# Patient Record
Sex: Female | Born: 1935 | Race: White | Hispanic: No | Smoking: Never smoker
Health system: Southern US, Community
[De-identification: ages and names within clinical notes are randomized; demographics above are authoritative.]

## PROBLEM LIST (undated history)

## (undated) DIAGNOSIS — E119 Type 2 diabetes mellitus without complications: Secondary | ICD-10-CM

## (undated) DIAGNOSIS — E039 Hypothyroidism, unspecified: Secondary | ICD-10-CM

## (undated) DIAGNOSIS — I2510 Atherosclerotic heart disease of native coronary artery without angina pectoris: Secondary | ICD-10-CM

## (undated) DIAGNOSIS — I255 Ischemic cardiomyopathy: Secondary | ICD-10-CM

## (undated) DIAGNOSIS — I219 Acute myocardial infarction, unspecified: Secondary | ICD-10-CM

## (undated) DIAGNOSIS — I10 Essential (primary) hypertension: Secondary | ICD-10-CM

## (undated) DIAGNOSIS — I5022 Chronic systolic (congestive) heart failure: Secondary | ICD-10-CM

## (undated) DIAGNOSIS — I447 Left bundle-branch block, unspecified: Secondary | ICD-10-CM

## (undated) DIAGNOSIS — E785 Hyperlipidemia, unspecified: Secondary | ICD-10-CM

## (undated) HISTORY — DX: Essential (primary) hypertension: I10

## (undated) HISTORY — PX: MOHS SURGERY: SHX181

## (undated) HISTORY — DX: Left bundle-branch block, unspecified: I44.7

## (undated) HISTORY — DX: Acute myocardial infarction, unspecified: I21.9

## (undated) HISTORY — DX: Hypothyroidism, unspecified: E03.9

## (undated) HISTORY — DX: Chronic systolic (congestive) heart failure: I50.22

## (undated) HISTORY — PX: CARPAL TUNNEL RELEASE: SHX101

## (undated) HISTORY — DX: Atherosclerotic heart disease of native coronary artery without angina pectoris: I25.10

## (undated) HISTORY — DX: Type 2 diabetes mellitus without complications: E11.9

## (undated) HISTORY — DX: Ischemic cardiomyopathy: I25.5

## (undated) HISTORY — PX: TOTAL KNEE ARTHROPLASTY: SHX125

## (undated) HISTORY — DX: Hyperlipidemia, unspecified: E78.5

## (undated) HISTORY — PX: SHOULDER SURGERY: SHX246

## (undated) HISTORY — PX: TONSILLECTOMY: SUR1361

## (undated) HISTORY — PX: TOTAL ABDOMINAL HYSTERECTOMY: SHX209

---

## 2014-01-08 DIAGNOSIS — I10 Essential (primary) hypertension: Secondary | ICD-10-CM | POA: Insufficient documentation

## 2014-01-08 DIAGNOSIS — E039 Hypothyroidism, unspecified: Secondary | ICD-10-CM | POA: Insufficient documentation

## 2014-01-08 DIAGNOSIS — E119 Type 2 diabetes mellitus without complications: Secondary | ICD-10-CM | POA: Insufficient documentation

## 2014-02-27 DIAGNOSIS — I219 Acute myocardial infarction, unspecified: Secondary | ICD-10-CM

## 2014-02-27 HISTORY — DX: Acute myocardial infarction, unspecified: I21.9

## 2014-03-15 ENCOUNTER — Other Ambulatory Visit: Payer: Self-pay | Admitting: Family Medicine

## 2014-03-15 ENCOUNTER — Other Ambulatory Visit: Payer: Self-pay | Admitting: Emergency Medicine

## 2014-03-15 ENCOUNTER — Ambulatory Visit: Payer: Self-pay | Admitting: Family Medicine

## 2014-03-15 LAB — CBC
HCT: 39 % (ref 35.0–47.0)
HGB: 13.5 g/dL (ref 12.0–16.0)
MCH: 30.9 pg (ref 26.0–34.0)
MCHC: 34.6 g/dL (ref 32.0–36.0)
MCV: 89 fL (ref 80–100)
Platelet: 245 10*3/uL (ref 150–440)
RBC: 4.36 10*6/uL (ref 3.80–5.20)
RDW: 13 % (ref 11.5–14.5)
WBC: 8.5 10*3/uL (ref 3.6–11.0)

## 2014-03-15 LAB — BASIC METABOLIC PANEL
Anion Gap: 9 (ref 7–16)
BUN: 13 mg/dL (ref 7–18)
CALCIUM: 8.6 mg/dL (ref 8.5–10.1)
CREATININE: 0.87 mg/dL (ref 0.60–1.30)
Chloride: 99 mmol/L (ref 98–107)
Co2: 25 mmol/L (ref 21–32)
EGFR (African American): >60
GLUCOSE: 275 mg/dL — AB (ref 65–99)
OSMOLALITY: 276 (ref 275–301)
Potassium: 3.7 mmol/L (ref 3.5–5.1)
SODIUM: 133 mmol/L — AB (ref 136–145)

## 2014-03-15 LAB — TROPONIN I: TROPONIN-I: 0.3 ng/mL — AB

## 2014-03-15 LAB — APTT: ACTIVATED PTT: 31.5 s (ref 23.6–35.9)

## 2014-03-15 IMAGING — CR DG CHEST 2V
1 series · 2 of 2 positions shown · non-contrast
Comparison: None.

CLINICAL DATA: Chest pain

EXAM:
CHEST  2 VIEW

[Series 1: dxr chest pa (or ap) and lateral · 0.14mm/px · 2 of 2 slices shown]
[im 1/2]
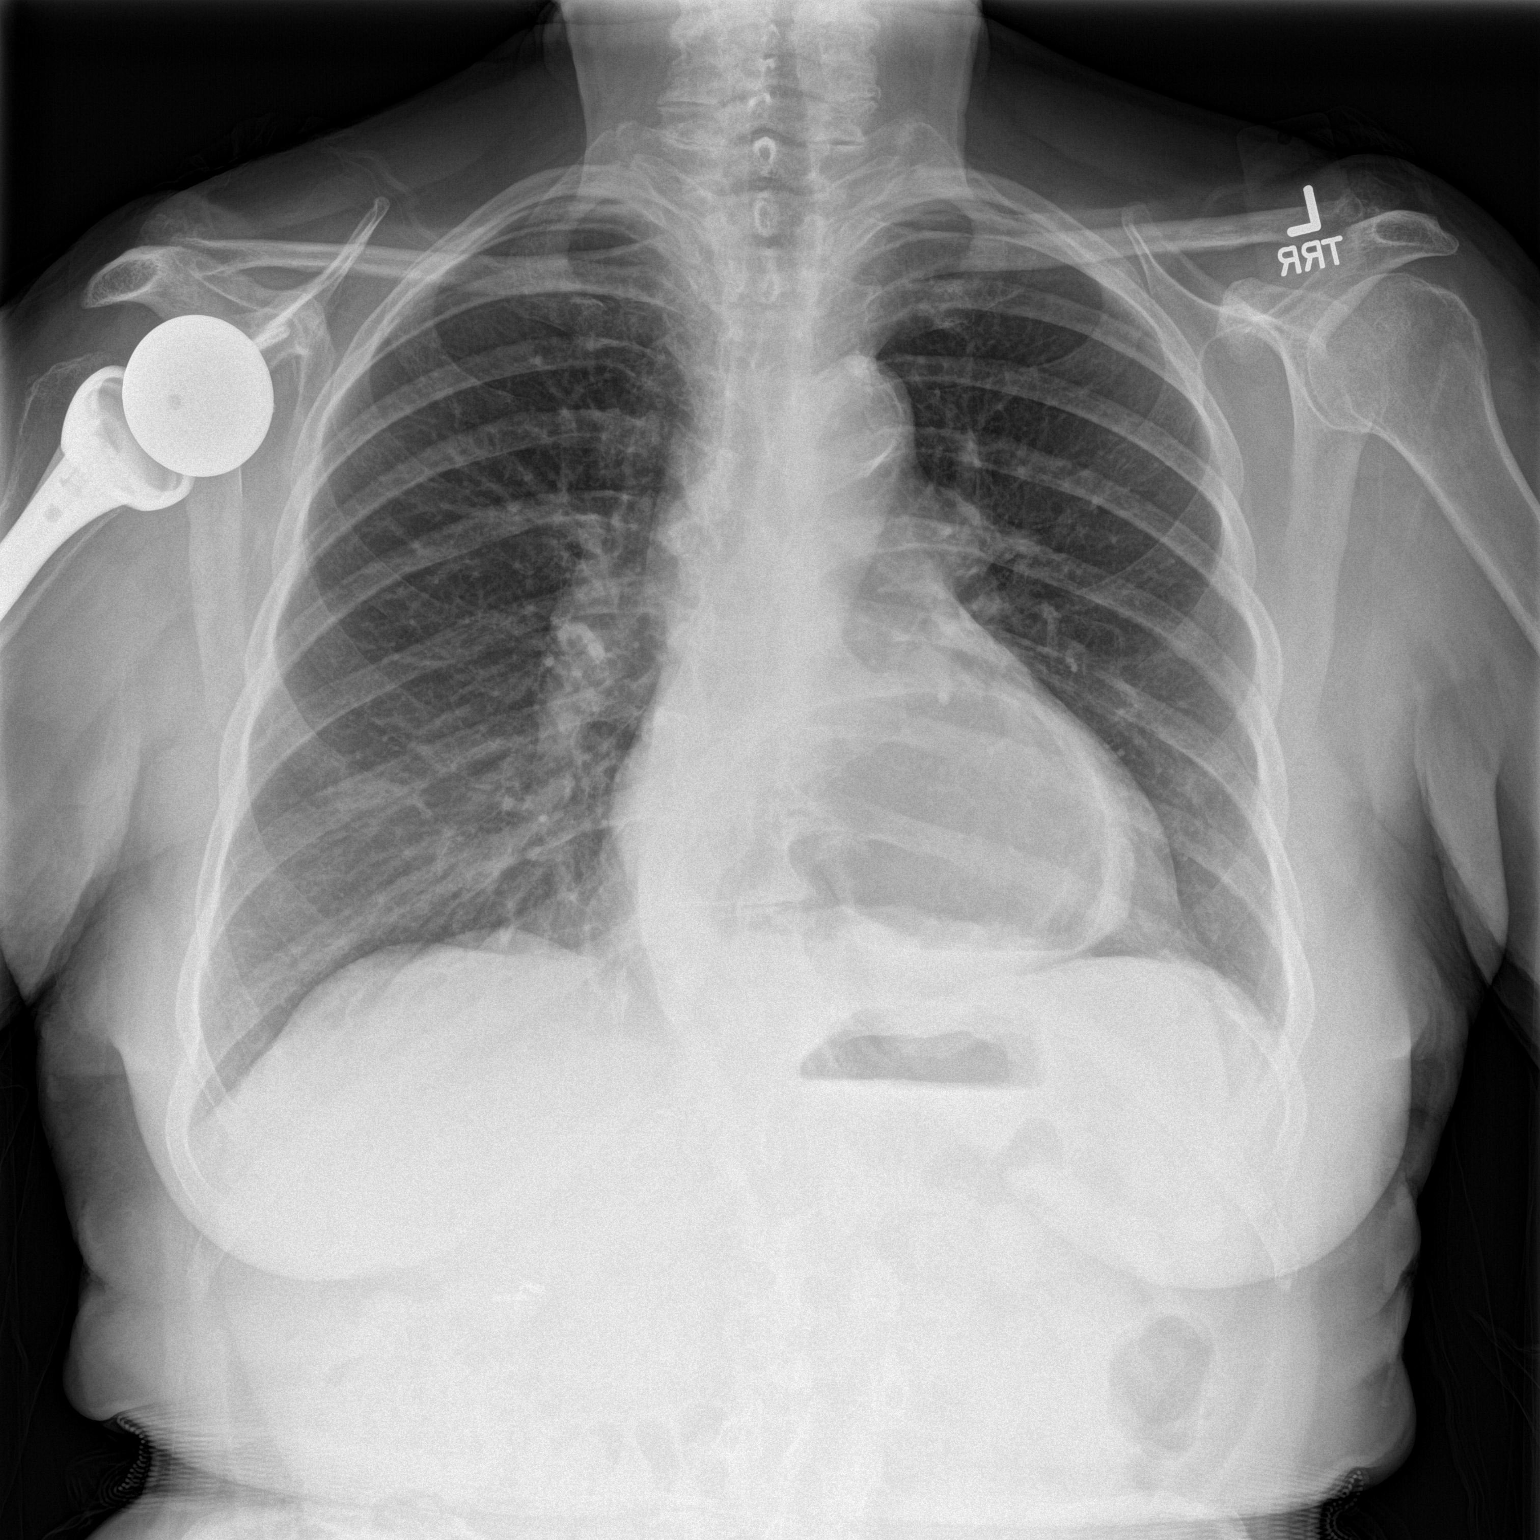
[im 2/2]
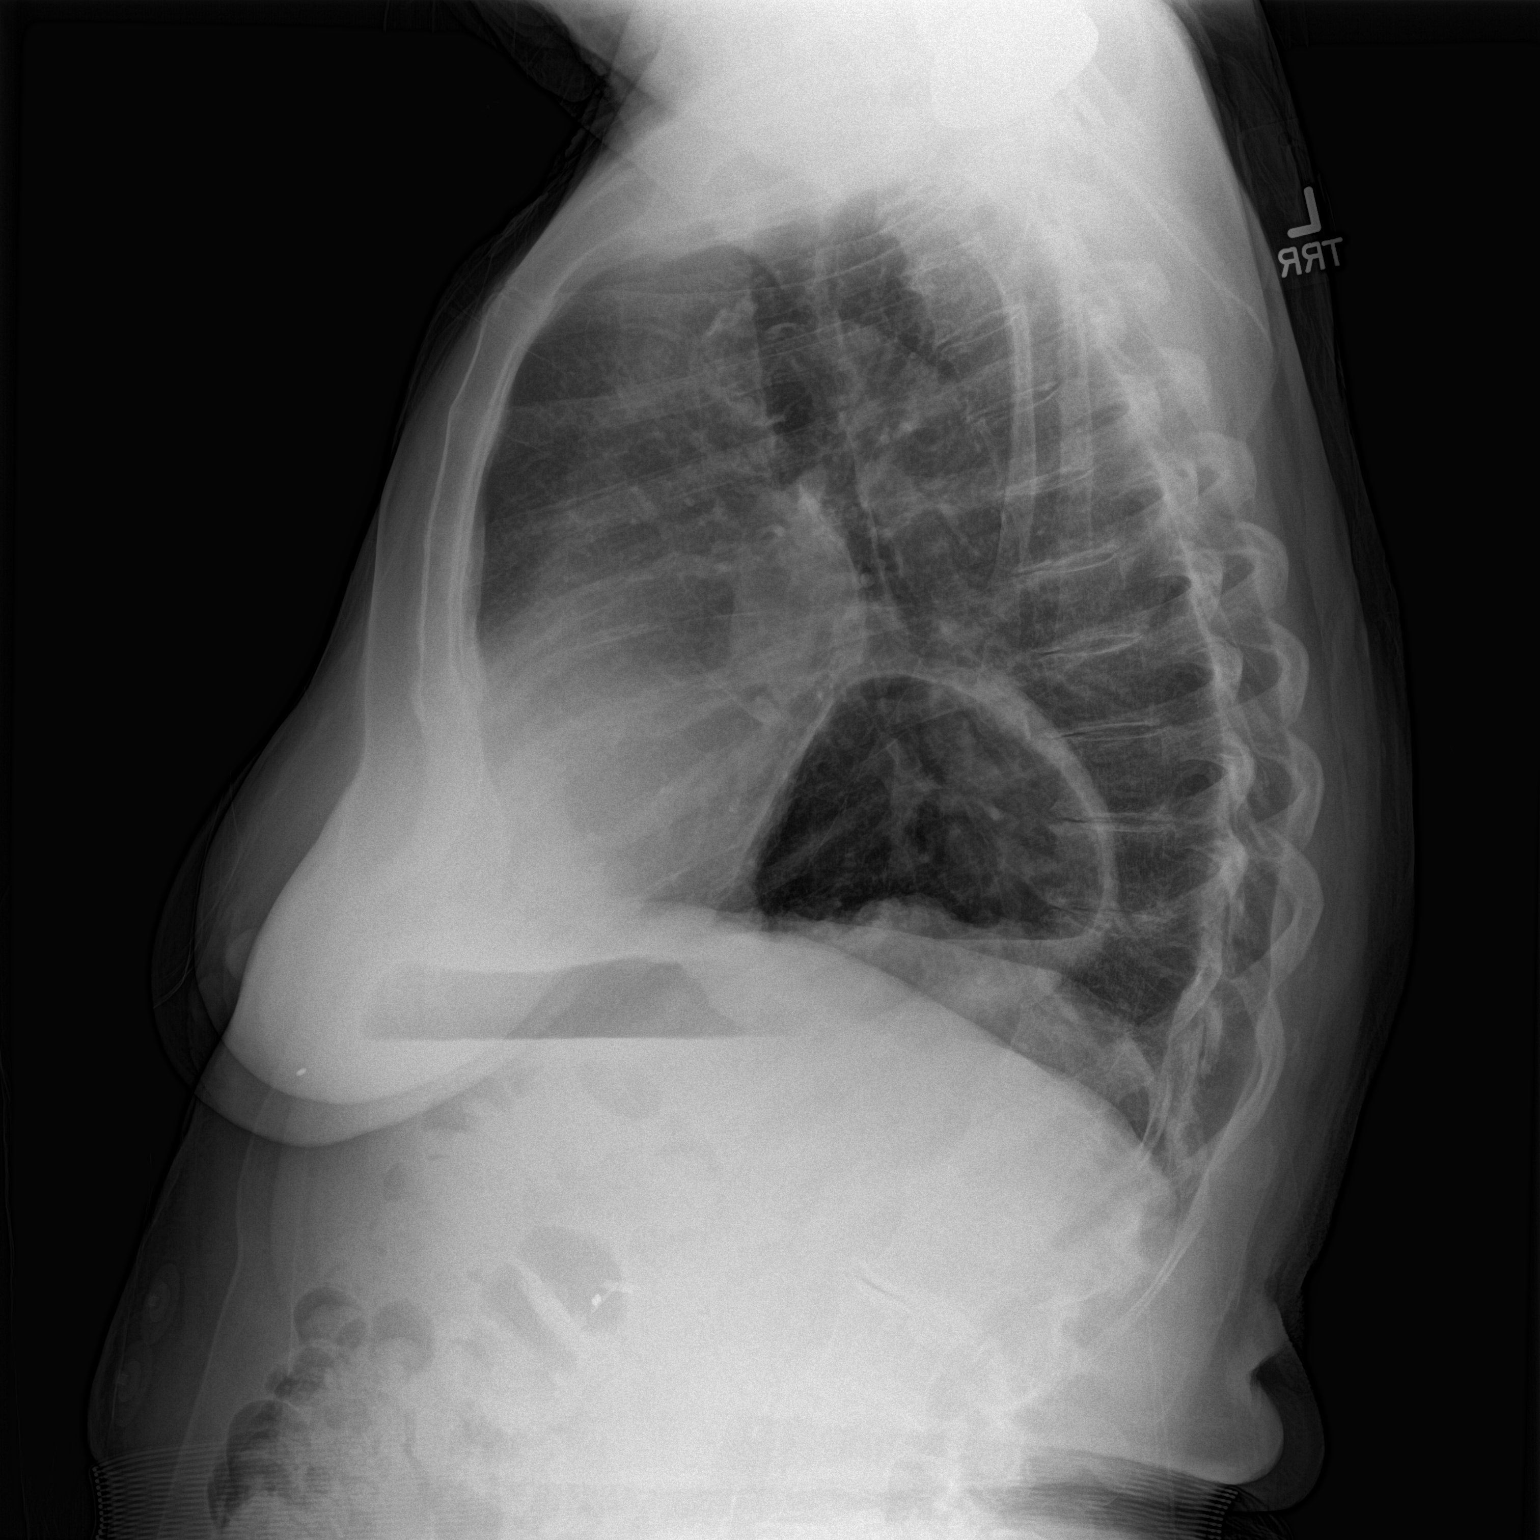

[2 of 2 positions shown; findings below may reference images not displayed]

FINDINGS: Heart size is normal. There is calcified atherosclerotic plaque
involving the aortic arch. Large hiatal hernia is noted. No pleural
effusion or edema. No airspace consolidation. There is mild
spondylosis within the thoracic spine.
IMPRESSION: 1. No acute findings.
2. Hiatal hernia
3. Atherosclerosis

## 2014-03-16 ENCOUNTER — Other Ambulatory Visit: Payer: Self-pay | Admitting: Cardiovascular Disease

## 2014-03-16 ENCOUNTER — Other Ambulatory Visit: Payer: Self-pay | Admitting: Internal Medicine

## 2014-03-16 ENCOUNTER — Inpatient Hospital Stay: Payer: Self-pay | Admitting: Internal Medicine

## 2014-03-16 DIAGNOSIS — I214 Non-ST elevation (NSTEMI) myocardial infarction: Secondary | ICD-10-CM

## 2014-03-16 DIAGNOSIS — I10 Essential (primary) hypertension: Secondary | ICD-10-CM

## 2014-03-16 DIAGNOSIS — I340 Nonrheumatic mitral (valve) insufficiency: Secondary | ICD-10-CM

## 2014-03-16 DIAGNOSIS — I2510 Atherosclerotic heart disease of native coronary artery without angina pectoris: Secondary | ICD-10-CM

## 2014-03-16 HISTORY — PX: CARDIAC CATHETERIZATION: SHX172

## 2014-03-16 LAB — TROPONIN I
TROPONIN-I: 3.5 ng/mL — AB
Troponin-I: 4.9 ng/mL — ABNORMAL HIGH

## 2014-03-16 LAB — CBC WITH DIFFERENTIAL/PLATELET
BASOS ABS: 0 10*3/uL (ref 0.0–0.1)
Basophil %: 0.3 %
Eosinophil #: 0.1 10*3/uL (ref 0.0–0.7)
Eosinophil %: 0.7 %
HCT: 37.9 % (ref 35.0–47.0)
HGB: 13 g/dL (ref 12.0–16.0)
Lymphocyte #: 2.1 10*3/uL (ref 1.0–3.6)
Lymphocyte %: 23.4 %
MCH: 30.9 pg (ref 26.0–34.0)
MCHC: 34.3 g/dL (ref 32.0–36.0)
MCV: 90 fL (ref 80–100)
MONOS PCT: 6.3 %
Monocyte #: 0.6 x10 3/mm (ref 0.2–0.9)
NEUTROS PCT: 69.3 %
Neutrophil #: 6.1 10*3/uL (ref 1.4–6.5)
Platelet: 225 10*3/uL (ref 150–440)
RBC: 4.2 10*6/uL (ref 3.80–5.20)
RDW: 13.1 % (ref 11.5–14.5)
WBC: 8.9 10*3/uL (ref 3.6–11.0)

## 2014-03-16 LAB — CK-MB
CK-MB: 4 ng/mL — ABNORMAL HIGH (ref 0.5–3.6)
CK-MB: 12.4 ng/mL — AB (ref 0.5–3.6)
CK-MB: 13.8 ng/mL — AB (ref 0.5–3.6)

## 2014-03-16 LAB — LIPID PANEL
Cholesterol: 145 mg/dL (ref 0–200)
HDL Cholesterol: 40 mg/dL (ref 40–60)
Ldl Cholesterol, Calc: 70 mg/dL (ref 0–100)
Triglycerides: 173 mg/dL (ref 0–200)
VLDL Cholesterol, Calc: 35 mg/dL (ref 5–40)

## 2014-03-16 LAB — PROTIME-INR
INR: 0.9
PROTHROMBIN TIME: 12 s (ref 11.5–14.7)

## 2014-03-16 LAB — HEPARIN LEVEL (UNFRACTIONATED): ANTI-XA(UNFRACTIONATED): 0.17 [IU]/mL — AB (ref 0.30–0.70)

## 2014-03-17 LAB — BASIC METABOLIC PANEL
ANION GAP: 8 (ref 7–16)
BUN: 10 mg/dL (ref 7–18)
Calcium, Total: 8.8 mg/dL (ref 8.5–10.1)
Chloride: 101 mmol/L (ref 98–107)
Co2: 26 mmol/L (ref 21–32)
Creatinine: 0.58 mg/dL — ABNORMAL LOW (ref 0.60–1.30)
EGFR (Non-African Amer.): >60
Glucose: 120 mg/dL — ABNORMAL HIGH (ref 65–99)
OSMOLALITY: 270 (ref 275–301)
POTASSIUM: 3.5 mmol/L (ref 3.5–5.1)
Sodium: 135 mmol/L — ABNORMAL LOW (ref 136–145)

## 2014-03-17 LAB — CBC WITH DIFFERENTIAL/PLATELET
BASOS ABS: 0 10*3/uL (ref 0.0–0.1)
Basophil %: 0.4 %
EOS PCT: 1.4 %
Eosinophil #: 0.1 10*3/uL (ref 0.0–0.7)
HCT: 36.5 % (ref 35.0–47.0)
HGB: 12.7 g/dL (ref 12.0–16.0)
LYMPHS PCT: 29.8 %
Lymphocyte #: 1.9 10*3/uL (ref 1.0–3.6)
MCH: 31.4 pg (ref 26.0–34.0)
MCHC: 34.9 g/dL (ref 32.0–36.0)
MCV: 90 fL (ref 80–100)
MONOS PCT: 10.3 %
Monocyte #: 0.6 x10 3/mm (ref 0.2–0.9)
NEUTROS ABS: 3.6 10*3/uL (ref 1.4–6.5)
NEUTROS PCT: 58.1 %
Platelet: 194 10*3/uL (ref 150–440)
RBC: 4.05 10*6/uL (ref 3.80–5.20)
RDW: 13.2 % (ref 11.5–14.5)
WBC: 6.3 10*3/uL (ref 3.6–11.0)

## 2014-03-17 LAB — CK TOTAL AND CKMB (NOT AT ARMC)
CK, Total: 106 U/L (ref 26–192)
CK-MB: 5.6 ng/mL — ABNORMAL HIGH (ref 0.5–3.6)

## 2014-03-20 ENCOUNTER — Telehealth: Payer: Self-pay

## 2014-03-20 NOTE — Telephone Encounter (Signed)
Patient contacted regarding discharge from [facility] on 03/17/14.  Patient understands to follow up with Dr. Fletcher Anon on 04/06/13 at 9:45 at [facility]. Patient understands discharge instructions? yes Patient understands medications and regiment? yes Patient understands to bring all medications to this visit? yes

## 2014-03-21 ENCOUNTER — Encounter: Payer: Self-pay | Admitting: Cardiovascular Disease

## 2014-03-21 ENCOUNTER — Telehealth: Payer: Self-pay | Admitting: Cardiovascular Disease

## 2014-03-21 NOTE — Telephone Encounter (Signed)
Patient is having muscle weakness

## 2014-03-21 NOTE — Telephone Encounter (Signed)
Pt c/o Shortness Of Breath:  1. Are you currently SOB (can you hear that pt is SOB on the phone)?  Patient was shoert of breath when talking on the phone and walking. She stated it did not seem emergent   2. How long have you been experiencing SOB?  She stated she has been sob in the past but had a stent last week and recently started Crestor. She feels the SOB is worse since starting Crestor 20 mg once daily  3. Are you SOB when sitting or when up moving around?  SOB worsens with talking and activity   4. Are you currently experiencing any other symptoms?  She stated she feels "just a bit different"  She is experiencing muscle weakness and fatigue

## 2014-03-21 NOTE — Telephone Encounter (Signed)
-  Have patient stop the statin and come in for a stat CK level to r/o rhabo. -Make sure she is taking DAPT daily. -We can see how she feels off the statin - if she feels well we can try her on a different statin. -If symptoms persist we will re-evaluate.

## 2014-03-21 NOTE — Telephone Encounter (Signed)
Pt c/o medication issue:  1. Name of Medication:  Crestor  2. How are you currently taking this medication (dosage and times per day)? 1 tablet once daily 20 mg  3. Are you having a reaction (difficulty breathing--STAT)?   4. What is your medication issue?  Feeling more like muscle weakness. Just feels a bit different.  It has been going on since Sunday. Had sten put in on Friday.

## 2014-03-22 NOTE — Telephone Encounter (Signed)
Spoke w/ pt.  Advised her of Ryan's recommendation. She declines coming in for labs today, as it is Christmas Eve.  She reports that she has an appt on 04/02/14 w/ her PCP and will ask them to draw labs at that time. She will call back to let us know how she is doing after stopping statin.  She reports that she felt better yesterday after drinking a Gatorade and is concerned that she may have been dehydrated.

## 2014-03-29 ENCOUNTER — Encounter: Payer: Self-pay | Admitting: *Deleted

## 2014-04-06 ENCOUNTER — Encounter (INDEPENDENT_AMBULATORY_CARE_PROVIDER_SITE_OTHER): Payer: Self-pay

## 2014-04-06 ENCOUNTER — Ambulatory Visit (INDEPENDENT_AMBULATORY_CARE_PROVIDER_SITE_OTHER): Payer: Medicare Other | Admitting: Cardiovascular Disease

## 2014-04-06 ENCOUNTER — Encounter: Payer: Self-pay | Admitting: Cardiovascular Disease

## 2014-04-06 VITALS — BP 156/74 | HR 68 | Ht 60.0 in | Wt 145.5 lb

## 2014-04-06 DIAGNOSIS — I2510 Atherosclerotic heart disease of native coronary artery without angina pectoris: Secondary | ICD-10-CM | POA: Insufficient documentation

## 2014-04-06 DIAGNOSIS — Z9889 Other specified postprocedural states: Secondary | ICD-10-CM

## 2014-04-06 DIAGNOSIS — E785 Hyperlipidemia, unspecified: Secondary | ICD-10-CM | POA: Insufficient documentation

## 2014-04-06 DIAGNOSIS — R000 Tachycardia, unspecified: Secondary | ICD-10-CM

## 2014-04-06 DIAGNOSIS — I5022 Chronic systolic (congestive) heart failure: Secondary | ICD-10-CM

## 2014-04-06 MED ORDER — LISINOPRIL 10 MG PO TABS: 10.0000 mg | ORAL_TABLET | Freq: Every day | ORAL | Status: DC

## 2014-04-06 MED ORDER — CARVEDILOL 6.25 MG PO TABS: 6.2500 mg | ORAL_TABLET | Freq: Two times a day (BID) | ORAL | Status: DC

## 2014-04-06 NOTE — Progress Notes (Signed)
Primary care physician: Dr. Petra Kuba  HPI  This is a pleasant 81 year old female who is here today for a follow-up visit after recent hospitalization at [facility]. She has known history of hypertension and type 2 diabetes. She has previous history of palpitations controlled with atenolol. She presented with non-ST elevation myocardial infarction with anterior ischemic changes on EKG. She underwent cardiac catheterization via the left radial artery which showed 99% stenosis in the mid LAD with ejection fraction of 40%. PCI and drug-eluting stent placement was performed to the LAD without complications. Since then, she denies any chest discomfort or shortness of breath. She did have myalgia with Crestor and was switched to atorvastatin. She feels better since then.  Allergies  Allergen Reactions  . Codeine   . Sulfa Antibiotics      Current Outpatient Prescriptions on File Prior to Visit  Medication Sig Dispense Refill  . aspirin 325 MG EC tablet Take 325 mg by mouth daily.    . clopidogrel (PLAVIX) 75 MG tablet Take 75 mg by mouth daily.    . clorazepate (TRANXENE) 3.75 MG tablet Take 3.75 mg by mouth daily.     . metFORMIN (GLUCOPHAGE) 500 MG tablet Take 500 mg by mouth daily with breakfast.    . nitroGLYCERIN (NITROSTAT) 0.4 MG SL tablet Place 0.4 mg under the tongue every 5 (five) minutes as needed for chest pain.    Marland Kitchen thyroid (ARMOUR) 120 MG tablet Take 120 mg by mouth daily before breakfast.     No current facility-administered medications on file prior to visit.     Past Medical History  Diagnosis Date  . Diabetes mellitus without complication   . Hypertension   . Hypothyroidism   . MI (myocardial infarction)   . Coronary artery disease     Non-ST elevation myocardial infarction in December 2015. Cardiac catheterization showed 99% mid LAD stenosis, ejection fraction of 40% with moderate anterolateral and apical hypokinesis. She underwent PCI and drug-eluting stent placement to the mid  LAD (2.5 x 33 mm Xience drug-eluting stent) . There was also 50% distal left circumflex stenosis.  . Hyperlipidemia   . Chronic systolic heart failure     Due to ischemic cardiomyopathy with ejection fraction of 40%     Past Surgical History  Procedure Laterality Date  . Cardiac catheterization  03/16/2014    x1 stent  . Total knee arthroplasty Left   . Shoulder surgery    . Carpal tunnel release    . Total abdominal hysterectomy    . Tonsillectomy       Family History  Problem Relation Age of Onset  . Heart attack Father 26  . Heart attack Brother 80     History   Social History  . Marital Status: Married    Spouse Name: N/A    Number of Children: N/A  . Years of Education: N/A   Occupational History  . Not on file.   Social History Main Topics  . Smoking status: Never Smoker   . Smokeless tobacco: Not on file  . Alcohol Use: No  . Drug Use: No  . Sexual Activity: Not on file   Other Topics Concern  . Not on file   Social History Narrative      PHYSICAL EXAM   BP 156/74 mmHg  Pulse 68  Ht 5' (1.524 m)  Wt 145 lb 8 oz (65.998 kg)  BMI 28.42 kg/m2 Constitutional: She is oriented to person, place, and time. She appears well-developed and well-nourished.  No distress.  HENT: No nasal discharge.  Head: Normocephalic and atraumatic.  Eyes: Pupils are equal and round. No discharge.  Neck: Normal range of motion. Neck supple. No JVD present. No thyromegaly present.  Cardiovascular: Normal rate, regular rhythm, normal heart sounds. Exam reveals no gallop and no friction rub. No murmur heard.  Pulmonary/Chest: Effort normal and breath sounds normal. No stridor. No respiratory distress. She has no wheezes. She has no rales. She exhibits no tenderness.  Abdominal: Soft. Bowel sounds are normal. She exhibits no distension. There is no tenderness. There is no rebound and no guarding.  Musculoskeletal: Normal range of motion. She exhibits no edema and no  tenderness.  Neurological: She is alert and oriented to person, place, and time. Coordination normal.  Skin: Skin is warm and dry. No rash noted. She is not diaphoretic. No erythema. No pallor.  Psychiatric: She has a normal mood and affect. Her behavior is normal. Judgment and thought content normal.  Left radial pulse is normal with no hematoma   KDT:OIZTI  Rhythm  Low voltage in precordial leads.   -  Extensive T-abnormality  - Anterior/lateral and inferior ischemia.   ABNORMAL     ASSESSMENT AND PLAN

## 2014-04-06 NOTE — Patient Instructions (Addendum)
Your physician has recommended you make the following change in your medication:  Stop Atenolol- Chlorthalidone  Stop Atenolol  Start Coreg 6.25 mg twice daily  Start Lisinopril 10 mg once daily   Your physician recommends that you return for lab work in:  BMP in 1 week    Your physician recommends that you schedule a follow-up appointment in:  1 month (in the morning)  Your physician recommends that you return for lab work in:  Fasting labs at your 1 month follow up

## 2014-04-06 NOTE — Assessment & Plan Note (Signed)
Ejection fraction was 40% due to ischemic cardiomyopathy. There is no evidence of fluid overload. Due to ischemic cardiomyopathy, I stopped atenolol and hydrochlorothiazide and started carvedilol and lisinopril. Check basic metabolic profile in one week.

## 2014-04-06 NOTE — Assessment & Plan Note (Signed)
She had myalgia with Crestor which improved on atorvastatin. I recommend a target LDL of less than 70. We'll obtain fasting lipid and liver profile in one month.

## 2014-04-06 NOTE — Assessment & Plan Note (Addendum)
The patient is doing reasonably well after her recent non-ST elevation myocardial infarction and drug-eluting stent placement to the mid LAD. I recommend continuing dual antiplatelet therapy for at least one year. I discussed with her cardiac rehabilitation and she wants to think about this. Decrease aspirin to 81 mg once daily.

## 2014-04-13 ENCOUNTER — Other Ambulatory Visit (INDEPENDENT_AMBULATORY_CARE_PROVIDER_SITE_OTHER): Payer: Medicare Other | Admitting: *Deleted

## 2014-04-13 DIAGNOSIS — R000 Tachycardia, unspecified: Secondary | ICD-10-CM

## 2014-04-13 DIAGNOSIS — Z9889 Other specified postprocedural states: Secondary | ICD-10-CM

## 2014-04-14 LAB — BASIC METABOLIC PANEL
BUN / CREAT RATIO: 16 (ref 11–26)
BUN: 12 mg/dL (ref 8–27)
CHLORIDE: 102 mmol/L (ref 97–108)
CO2: 19 mmol/L (ref 18–29)
Calcium: 9.7 mg/dL (ref 8.7–10.3)
Creatinine, Ser: 0.74 mg/dL (ref 0.57–1.00)
GFR calc Af Amer: 88 mL/min/{1.73_m2} (ref >59)
GFR, EST NON AFRICAN AMERICAN: 77 mL/min/{1.73_m2} (ref >59)
Glucose: 130 mg/dL — ABNORMAL HIGH (ref 65–99)
Potassium: 4.9 mmol/L (ref 3.5–5.2)
SODIUM: 142 mmol/L (ref 134–144)

## 2014-04-27 DIAGNOSIS — M19072 Primary osteoarthritis, left ankle and foot: Secondary | ICD-10-CM | POA: Insufficient documentation

## 2014-05-07 ENCOUNTER — Encounter: Payer: Self-pay | Admitting: Cardiovascular Disease

## 2014-05-07 ENCOUNTER — Ambulatory Visit (INDEPENDENT_AMBULATORY_CARE_PROVIDER_SITE_OTHER): Payer: Medicare Other | Admitting: Cardiovascular Disease

## 2014-05-07 VITALS — BP 158/80 | HR 72 | Ht 60.0 in | Wt 147.0 lb

## 2014-05-07 DIAGNOSIS — J189 Pneumonia, unspecified organism: Secondary | ICD-10-CM

## 2014-05-07 DIAGNOSIS — I2510 Atherosclerotic heart disease of native coronary artery without angina pectoris: Secondary | ICD-10-CM

## 2014-05-07 DIAGNOSIS — E785 Hyperlipidemia, unspecified: Secondary | ICD-10-CM

## 2014-05-07 DIAGNOSIS — I5022 Chronic systolic (congestive) heart failure: Secondary | ICD-10-CM

## 2014-05-07 MED ORDER — LISINOPRIL 20 MG PO TABS: 20.0000 mg | ORAL_TABLET | Freq: Every day | ORAL | Status: DC

## 2014-05-07 MED ORDER — ASPIRIN EC 81 MG PO TBEC: 81.0000 mg | DELAYED_RELEASE_TABLET | Freq: Every day | ORAL | Status: DC

## 2014-05-07 MED ORDER — ALBUTEROL SULFATE HFA 108 (90 BASE) MCG/ACT IN AERS: 2.0000 | INHALATION_SPRAY | Freq: Four times a day (QID) | RESPIRATORY_TRACT | Status: AC | PRN

## 2014-05-07 NOTE — Progress Notes (Signed)
Primary care physician: Dr. Petra Kuba  HPI  This is a pleasant 81 year old female who is here today for a follow-up visit regarding coronary artery disease. She has known history of hypertension and type 2 diabetes. She has previous history of palpitations controlled with atenolol. She presentedin December 2015 with non-ST elevation myocardial infarction with anterior ischemic changes on EKG. She underwent cardiac catheterization via the left radial artery which showed 99% stenosis in the mid LAD with ejection fraction of 40%. PCI and drug-eluting stent placement was performed to the LAD without complications. Since then, she denies any chest discomfort or shortness of breath. She did have myalgia with Crestor and was switched to atorvastatin.  She is complaining of productive cough with blood tinged sputum. She was started on azithromycin on Friday. There has been slight improvement. No chest pain.  Allergies  Allergen Reactions  . Codeine   . Sulfa Antibiotics      Current Outpatient Prescriptions on File Prior to Visit  Medication Sig Dispense Refill  . aspirin 325 MG EC tablet Take 325 mg by mouth daily.    Marland Kitchen atorvastatin (LIPITOR) 20 MG tablet Take 20 mg by mouth daily.    . carvedilol (COREG) 6.25 MG tablet Take 1 tablet (6.25 mg total) by mouth 2 (two) times daily. 180 tablet 3  . clopidogrel (PLAVIX) 75 MG tablet Take 75 mg by mouth daily.    . clorazepate (TRANXENE) 3.75 MG tablet Take 3.75 mg by mouth daily.     . Coenzyme Q10 (CO Q 10) 100 MG CAPS Take by mouth daily.    Marland Kitchen D-Ribose (RIBOSE, D,) POWD by Does not apply route daily.    . LevOCARNitine (CARNITINE PO) Take 500 mg by mouth daily.    Marland Kitchen lisinopril (PRINIVIL,ZESTRIL) 10 MG tablet Take 1 tablet (10 mg total) by mouth daily. 90 tablet 3  . MAGNESIUM CITRATE PO Take 500 mg by mouth daily.    . metFORMIN (GLUCOPHAGE) 500 MG tablet Take 500 mg by mouth daily with breakfast.    . nitroGLYCERIN (NITROSTAT) 0.4 MG SL tablet Place  0.4 mg under the tongue every 5 (five) minutes as needed for chest pain.    Marland Kitchen thyroid (ARMOUR) 120 MG tablet Take 120 mg by mouth daily before breakfast.     No current facility-administered medications on file prior to visit.     Past Medical History  Diagnosis Date  . Diabetes mellitus without complication   . Hypertension   . Hypothyroidism   . MI (myocardial infarction)   . Hyperlipidemia   . Chronic systolic heart failure     Due to ischemic cardiomyopathy with ejection fraction of 40%  . Coronary artery disease     Non-ST elevation myocardial infarction in December 2015. Cardiac catheterization showed 99% mid LAD stenosis, ejection fraction of 40% with moderate anterolateral and apical hypokinesis. She underwent PCI and drug-eluting stent placement to the mid LAD (2.5 x 33 mm Xience drug-eluting stent) . There was also 50% distal left circumflex stenosis.     Past Surgical History  Procedure Laterality Date  . Cardiac catheterization  03/16/2014    x1 stent  . Total knee arthroplasty Left   . Shoulder surgery    . Carpal tunnel release    . Total abdominal hysterectomy    . Tonsillectomy       Family History  Problem Relation Age of Onset  . Heart attack Father 66  . Heart attack Brother 52     History  Social History  . Marital Status: Married    Spouse Name: N/A    Number of Children: N/A  . Years of Education: N/A   Occupational History  . Not on file.   Social History Main Topics  . Smoking status: Never Smoker   . Smokeless tobacco: Not on file  . Alcohol Use: No  . Drug Use: No  . Sexual Activity: Not on file   Other Topics Concern  . Not on file   Social History Narrative      PHYSICAL EXAM   BP 158/80 mmHg  Pulse 72  Ht 5' (1.524 m)  Wt 147 lb (66.679 kg)  BMI 28.71 kg/m2 Constitutional: She is oriented to person, place, and time. She appears well-developed and well-nourished. No distress.  HENT: No nasal discharge.  Head:  Normocephalic and atraumatic.  Eyes: Pupils are equal and round. No discharge.  Neck: Normal range of motion. Neck supple. No JVD present. No thyromegaly present.  Cardiovascular: Normal rate, regular rhythm, normal heart sounds. Exam reveals no gallop and no friction rub. No murmur heard.  Pulmonary/Chest: Effort normal and breath sounds normal. No stridor. No respiratory distress. She has expiratory wheezing with bronchial breath sounds at the right base. She has no rales. She exhibits no tenderness.  Abdominal: Soft. Bowel sounds are normal. She exhibits no distension. There is no tenderness. There is no rebound and no guarding.  Musculoskeletal: Normal range of motion. She exhibits no edema and no tenderness.  Neurological: She is alert and oriented to person, place, and time. Coordination normal.  Skin: Skin is warm and dry. No rash noted. She is not diaphoretic. No erythema. No pallor.  Psychiatric: She has a normal mood and affect. Her behavior is normal. Judgment and thought content normal.  Left radial pulse is normal with no hematoma       ASSESSMENT AND PLAN

## 2014-05-07 NOTE — Patient Instructions (Addendum)
Your physician has recommended you make the following change in your medication:  Decrease Aspirin to 81 mg once daily  Increase Lisinopril to 20 mg once daily  Start Albuterol inhaler 2 puffs every 6 hrs as needed   Your physician recommends that you schedule a follow-up appointment in:  3 months

## 2014-05-08 DIAGNOSIS — J4521 Mild intermittent asthma with (acute) exacerbation: Secondary | ICD-10-CM | POA: Insufficient documentation

## 2014-05-10 DIAGNOSIS — J189 Pneumonia, unspecified organism: Secondary | ICD-10-CM | POA: Insufficient documentation

## 2014-05-10 NOTE — Assessment & Plan Note (Signed)
The patient's exam was suggestive of pneumonia. She has blood tinged sputum as well. I asked her to decrease the dose of aspirin to 81 mg once daily. Unfortunately, dual antiplatelet therapy cannot be interrupted at the present time and thus there is life-threatening bleeding given recent drug-eluting stent placement in December. She has already been started on antibiotic. Due to wheezing, I gave her a prescription for albuterol inhaler. I advised her to seek further care if symptoms do not improve.

## 2014-05-10 NOTE — Assessment & Plan Note (Signed)
She has no symptoms suggestive of angina. Continue medical therapy.

## 2014-05-10 NOTE — Assessment & Plan Note (Addendum)
She appears to be euvolemic without a diuretic. She is currently on carvedilol and lisinopril. Due to elevated blood pressure, I increased the dose of lisinopril to 20 mg once daily.

## 2014-05-10 NOTE — Assessment & Plan Note (Signed)
Continue treatment with atorvastatin with a target LDL of less than 70. 

## 2014-06-21 ENCOUNTER — Telehealth: Payer: Self-pay | Admitting: *Deleted

## 2014-06-21 ENCOUNTER — Other Ambulatory Visit: Payer: Self-pay | Admitting: *Deleted

## 2014-06-21 MED ORDER — CLOPIDOGREL BISULFATE 75 MG PO TABS: 75.0000 mg | ORAL_TABLET | Freq: Every day | ORAL | Status: DC

## 2014-06-21 NOTE — Telephone Encounter (Signed)
°  1. Which medications need to be refilled? Clopidogrel  2. Which pharmacy is medication to be sent to? [facility]   3. Do they need a 30 day or 90 day supply? 90 would be nice.   4. Would they like a call back once the medication has been sent to the pharmacy? no

## 2014-06-29 DIAGNOSIS — I219 Acute myocardial infarction, unspecified: Secondary | ICD-10-CM | POA: Insufficient documentation

## 2014-06-29 DIAGNOSIS — I214 Non-ST elevation (NSTEMI) myocardial infarction: Secondary | ICD-10-CM | POA: Insufficient documentation

## 2014-06-29 DIAGNOSIS — E119 Type 2 diabetes mellitus without complications: Secondary | ICD-10-CM | POA: Insufficient documentation

## 2014-07-31 DIAGNOSIS — E1140 Type 2 diabetes mellitus with diabetic neuropathy, unspecified: Secondary | ICD-10-CM | POA: Insufficient documentation

## 2014-07-31 DIAGNOSIS — H8110 Benign paroxysmal vertigo, unspecified ear: Secondary | ICD-10-CM | POA: Insufficient documentation

## 2014-07-31 DIAGNOSIS — I252 Old myocardial infarction: Secondary | ICD-10-CM | POA: Insufficient documentation

## 2014-08-06 ENCOUNTER — Ambulatory Visit (INDEPENDENT_AMBULATORY_CARE_PROVIDER_SITE_OTHER): Payer: Medicare Other | Admitting: Cardiovascular Disease

## 2014-08-06 ENCOUNTER — Encounter: Payer: Self-pay | Admitting: Cardiovascular Disease

## 2014-08-06 VITALS — BP 126/82 | HR 68 | Ht 60.0 in | Wt 144.0 lb

## 2014-08-06 DIAGNOSIS — E785 Hyperlipidemia, unspecified: Secondary | ICD-10-CM

## 2014-08-06 DIAGNOSIS — I5022 Chronic systolic (congestive) heart failure: Secondary | ICD-10-CM

## 2014-08-06 DIAGNOSIS — I2510 Atherosclerotic heart disease of native coronary artery without angina pectoris: Secondary | ICD-10-CM | POA: Diagnosis not present

## 2014-08-06 NOTE — Assessment & Plan Note (Signed)
She appears to be euvolemic. Blood pressure is optimal. Continue treatment with carvedilol and lisinopril. I reviewed her recent labs done with her primary care physician which overall were unremarkable.

## 2014-08-06 NOTE — Assessment & Plan Note (Signed)
She is doing very well with no symptoms suggestive of angina. I recommend continuing medical therapy. Dual antiplatelet therapy will be continued for a minimum of one year until September of this year.

## 2014-08-06 NOTE — Patient Instructions (Signed)
Continue same medications.   Your physician wants you to follow-up in: 6 months.  You will receive a reminder letter in the mail two months in advance. If you don't receive a letter, please call our office to schedule the follow-up appointment.  

## 2014-08-06 NOTE — Assessment & Plan Note (Signed)
Continue treatment with atorvastatin with a target LDL of less than 70. Consider a fasting lipid and liver profile.

## 2014-08-06 NOTE — Progress Notes (Signed)
HPI  This is a pleasant 81 year old female who is here today for a follow-up visit regarding coronary artery disease. She has known history of hypertension and type 2 diabetes. She has previous history of palpitations controlled with atenolol. She presentedin December 2015 with non-ST elevation myocardial infarction with anterior ischemic changes on EKG. She underwent cardiac catheterization via the left radial artery which showed 99% stenosis in the mid LAD with ejection fraction of 40%. PCI and drug-eluting stent placement was performed to the LAD without complications. Since then, she denies any chest discomfort or shortness of breath. She did have myalgia with Crestor and was switched to atorvastatin.  During last visit, she was recovering from pneumonia with some blood and sputum. This has resolved completely with antibiotics and she actually has been doing well. Her thyroid function was low and levothyroxine was increased. She is doing water aerobics with no significant exertional symptoms.  Allergies  Allergen Reactions  . Codeine   . Sulfa Antibiotics      Current Outpatient Prescriptions on File Prior to Visit  Medication Sig Dispense Refill  . albuterol (PROVENTIL HFA;VENTOLIN HFA) 108 (90 BASE) MCG/ACT inhaler Inhale 2 puffs into the lungs every 6 (six) hours as needed for wheezing or shortness of breath. 1 Inhaler 0  . aspirin EC 81 MG tablet Take 1 tablet (81 mg total) by mouth daily.    Marland Kitchen atorvastatin (LIPITOR) 20 MG tablet Take 20 mg by mouth daily.    . carvedilol (COREG) 6.25 MG tablet Take 1 tablet (6.25 mg total) by mouth 2 (two) times daily. 180 tablet 3  . clopidogrel (PLAVIX) 75 MG tablet Take 1 tablet (75 mg total) by mouth daily. 90 tablet 3  . clorazepate (TRANXENE) 3.75 MG tablet Take 3.75 mg by mouth daily.     . Coenzyme Q10 (CO Q 10) 100 MG CAPS Take by mouth daily.    Marland Kitchen D-Ribose (RIBOSE, D,) POWD by Does not apply route daily.    . LevOCARNitine (CARNITINE PO)  Take 500 mg by mouth daily.    Marland Kitchen lisinopril (PRINIVIL,ZESTRIL) 20 MG tablet Take 1 tablet (20 mg total) by mouth daily. 30 tablet 6  . MAGNESIUM CITRATE PO Take 500 mg by mouth daily.    . metFORMIN (GLUCOPHAGE) 500 MG tablet Take 500 mg by mouth daily with breakfast.    . nitroGLYCERIN (NITROSTAT) 0.4 MG SL tablet Place 0.4 mg under the tongue every 5 (five) minutes as needed for chest pain.     No current facility-administered medications on file prior to visit.     Past Medical History  Diagnosis Date  . Diabetes mellitus without complication   . Hypertension   . Hypothyroidism   . MI (myocardial infarction)   . Hyperlipidemia   . Chronic systolic heart failure     Due to ischemic cardiomyopathy with ejection fraction of 40%  . Coronary artery disease     Non-ST elevation myocardial infarction in December 2015. Cardiac catheterization showed 99% mid LAD stenosis, ejection fraction of 40% with moderate anterolateral and apical hypokinesis. She underwent PCI and drug-eluting stent placement to the mid LAD (2.5 x 33 mm Xience drug-eluting stent) . There was also 50% distal left circumflex stenosis.     Past Surgical History  Procedure Laterality Date  . Cardiac catheterization  03/16/2014    x1 stent  . Total knee arthroplasty Left   . Shoulder surgery    . Carpal tunnel release    . Total abdominal  hysterectomy    . Tonsillectomy       Family History  Problem Relation Age of Onset  . Heart attack Father 69  . Heart attack Brother 60     History   Social History  . Marital Status: Married    Spouse Name: N/A  . Number of Children: N/A  . Years of Education: N/A   Occupational History  . Not on file.   Social History Main Topics  . Smoking status: Never Smoker   . Smokeless tobacco: Not on file  . Alcohol Use: No  . Drug Use: No  . Sexual Activity: Not on file   Other Topics Concern  . Not on file   Social History Narrative      PHYSICAL  EXAM   BP 126/82 mmHg  Pulse 68  Ht 5' (1.524 m)  Wt 144 lb (65.318 kg)  BMI 28.12 kg/m2 Constitutional: She is oriented to person, place, and time. She appears well-developed and well-nourished. No distress.  HENT: No nasal discharge.  Head: Normocephalic and atraumatic.  Eyes: Pupils are equal and round. No discharge.  Neck: Normal range of motion. Neck supple. No JVD present. No thyromegaly present.  Cardiovascular: Normal rate, regular rhythm, normal heart sounds. Exam reveals no gallop and no friction rub. No murmur heard.  Pulmonary/Chest: Effort normal and breath sounds normal. No stridor. No respiratory distress. She has expiratory wheezing with bronchial breath sounds at the right base. She has no rales. She exhibits no tenderness.  Abdominal: Soft. Bowel sounds are normal. She exhibits no distension. There is no tenderness. There is no rebound and no guarding.  Musculoskeletal: Normal range of motion. She exhibits no edema and no tenderness.  Neurological: She is alert and oriented to person, place, and time. Coordination normal.  Skin: Skin is warm and dry. No rash noted. She is not diaphoretic. No erythema. No pallor.  Psychiatric: She has a normal mood and affect. Her behavior is normal. Judgment and thought content normal.        ASSESSMENT AND PLAN

## 2014-08-29 DIAGNOSIS — M129 Arthropathy, unspecified: Secondary | ICD-10-CM | POA: Insufficient documentation

## 2014-08-29 DIAGNOSIS — M722 Plantar fascial fibromatosis: Secondary | ICD-10-CM | POA: Insufficient documentation

## 2014-09-27 NOTE — Discharge Summary (Signed)
PATIENT NAME:  Kendra Prince, Kendra Prince MR#:  [id number] DATE OF BIRTH:  [birth year]  DATE OF ADMISSION:  03/16/2014 DATE OF DISCHARGE:  03/17/2014  ADMITTING PHYSICIAN: Dr. Valentino Nose.   DISCHARGING PHYSICIAN: Gladstone Lighter, MD  PRIMARY CARE PHYSICIAN: Nonlocal in [place].   PRIMARY CARDIOLOGIST: None.   CONSULTATIONS IN THE HOSPITAL: Cardiology consultation by Dr. Fletcher Anon.   DISCHARGE DIAGNOSES: 1. Acute non- ST segment elevation myocardial infarction with 99% left anterior descending block and status post drug-eluding stent placement.  2. Hypertension.  3. Non-insulin-dependent diabetes mellitus.  4. Hypothyroidism.   DISCHARGE MEDICATIONS:  1. Armour Thyroid 120 mg p.o. daily.  2. Atenolol/chlorthalidone 50/25 mg 1 tablet p.o. daily.  3. Atenolol 25 mg p.o. at bedtime.  4. Tranxene 3.75 mg tablet 3 times a day as needed for anxiety.  5. Metformin 500 mg p.o. daily.  6. Sublingual nitroglycerin 0.4 mg p.r.n. for chest pain every 5 minutes.  7. Plavix 75 mg p.o. daily. 8. Aspirin 25 mg p.o. daily. 9. Crestor 20 mg p.o. daily.   DISCHARGE DIET: Low-sodium diet.   DISCHARGE ACTIVITY: No exertional activity for 4 weeks.   FOLLOWUP INSTRUCTIONS: 1. Follow up with cardiology in 1-2 weeks.  2. PCP follow-up in 2 weeks.  3. Advised to hold metformin now and restart it on 03/23/2014.   LABORATORY AND IMAGING STUDIES PRIOR TO DISCHARGE: WBC 6.3, hemoglobin 12.7, hematocrit 36.5, platelet count 194. Sodium 135, potassium 3.5, chloride 101, bicarbonate 26, BUN 10, creatinine 0.5, glucose 120, and calcium of 8.8. Troponins were elevate as high as 4.9 with elevated CK-MB in the hospital. LDL cholesterol 70, HDL 40, total cholesterol 145, and triglycerides 173.   Doppler showing LV ejection fraction is 45-50% with decreased global left ventricular systolic function and moderate hypokinesis of mid distal anterior and apical walls. Moderate pulmonary valve regurgitation noted. Cardiac  catheterization done on 03/16/2014 showing 1-vessel coronary artery disease with 99% lesion in the mid LAD, status post intervention and drug-eluding stent placement. Calculated ejection fraction was 40%.   BRIEF HOSPITAL COURSE: Kendra Prince is an 81 year old, very pleasant Caucasian female who is very active at baseline, history of hypertension, non-insulin-dependent diabetes mellitus, hypothyroidism, who presents to the hospital secondary to worsening dyspnea on exertion and chest pain, going on for almost 4 weeks prior to admission.   1.  Acute non- ST segment elevation myocardial infarction. The patient was admitted to telemetry. Troponins trended to rise in the hospital. She was started on heparin, seen by cardiologist Echo showed EF was 45% with moderate anterior wall hypokinesis. She underwent cardiac catheterization, which showed 99% LAD lesion, and she had a drug-eluding stent placed. Post procedure, the patient has been doing well. Denies any chest pain on exertion or dyspnea. She ambulated well. She started on aspirin and Plavix, and she is already on beta blocker with atenolol. Crestor has been added for statin benefit. She will follow up with cardiology as an outpatient.  2.  Hypertension. Her home medications are continued.  3.  Diabetes mellitus. Metformin was held in the hospital due to contrast exposure from cardiac cath. Advised to restart in a day.  4.  Hypothyroidism on Armour Thyroid.   5.  Her course has been otherwise uneventful in the hospital.   DISCHARGE CONDITION: Stable.   DISCHARGE DISPOSITION: Home.   TIME SPENT ON DISCHARGE: 40 minutes.   ____________________________ Gladstone Lighter, MD [id number] D: 03/17/2014 13:13:31 ET T: 03/17/2014 14:25:26 ET JOB#: [id number]  cc: Gladstone Lighter, MD, <Dictator> Muhammad A.  Fletcher Anon, MD Gladstone Lighter MD ELECTRONICALLY SIGNED 04/03/2014 14:48

## 2014-09-27 NOTE — Consult Note (Signed)
PATIENT NAME:  Kendra Prince, Kendra Prince MR#:  [id number] DATE OF BIRTH:  1932/03/01  DATE OF CONSULTATION:  03/16/2014  REFERRING PHYSICIAN:  Aaron Mose. Hower, MD CONSULTING PHYSICIAN:  Muhammad A. Fletcher Anon, MD  PRIMARY CARE PHYSICIAN: [place] Petra Kuba, MD.  REASON FOR CONSULTATION: Myocardial infarction.   HISTORY OF PRESENT ILLNESS: This is an 81 year old Caucasian female with no previous cardiac history. She has known history of hypertension and type 2 diabetes. She reports previous history of palpitations controlled with atenolol. She describes progressive, substernal, exertional chest pain since August, which always resolves at rest. However, yesterday after dinner she started having substernal chest pain at rest described as tightness with radiation to both arms. It was 6 out of 10 in intensity and continued for a few hours. She took aspirin with no relief. It was not associated with any other symptoms. She presented to the Emergency Room. ECG showed nonspecific anterior T wave changes. Troponin was found to be elevated. She is currently chest pain-free.   PAST MEDICAL HISTORY: Includes hypertension, type 2 diabetes, hypothyroidism, and unspecified tachycardia.   HOME MEDICATIONS: Include metformin 500 mg daily, atenolol-chlorthalidone 50/25 mg once daily, Tranxene 3.75 mg 3 times daily, and Armour Thyroid 120 mg once daily.   SOCIAL HISTORY: Negative for smoking, alcohol or recreational drug use.   FAMILY HISTORY: Remarkable for coronary artery disease in multiple family members but not prematurely.   REVIEW OF SYSTEMS: A 10-point review of systems was performed. It is negative other than what is mentioned in the HPI.   PHYSICAL EXAMINATION:  GENERAL: The patient appears to be at her stated age and in no acute distress.  VITAL SIGNS: Temperature is 98.2, pulse is 68, respiratory rate is 18, blood pressure is 139/75, and oxygen saturation is 92% on room air.  HEENT: Normocephalic, atraumatic.  NECK:  No JVD or carotid bruits.  RESPIRATORY: Normal respiratory effort with no use of accessory muscles. Auscultation reveals normal breath sounds.  CARDIOVASCULAR: Normal PMI. Normal S1 and S2 with no gallops or murmurs.  ABDOMEN: Benign, nontender, and nondistended.  EXTREMITIES: No clubbing, cyanosis, or edema.  SKIN: Warm and dry with no rash.  PSYCHIATRIC: She is alert, oriented x3 with normal mood and affect. Radial pulses are normal bilaterally.   LABORATORY AND DIAGNOSTIC DATA: Troponin was 0.3 which increased to 4.9 with a CK-MB of 13.8. Kidney function is normal and CBC is unremarkable. ECG showed sinus rhythm with first-degree AV block and nonspecific anterior T-wave changes.   IMPRESSION:  1. Non-ST elevation myocardial infarction.  2. Hypertension.  3. Type 2 diabetes.   RECOMMENDATIONS: The patient's history is consistent with progressive exertional angina that progressed into a non-ST-elevation myocardial infarction last night. I recommend continuing aspirin and unfractionated heparin. Continue treatment with atenolol and atorvastatin. I recommend proceeding with cardiac catheterization and possible coronary intervention. I discussed the procedure in detail as well as the potential risks and benefits. The patient understands and wants to proceed. The procedure will be done later today. Further recommendations to follow after cardiac catheterization.    ____________________________ Mertie Clause Fletcher Anon, MD [id number] D: 03/16/2014 08:53:03 ET T: 03/16/2014 11:10:27 ET JOB#: [id number]  cc: Muhammad A. Fletcher Anon, MD, <Dictator> Hall Busing. Petra Kuba, MD Aaron Mose. Hower, MD [place] MD ELECTRONICALLY SIGNED 03/30/2014 9:39

## 2014-12-28 ENCOUNTER — Other Ambulatory Visit: Payer: Self-pay | Admitting: Cardiovascular Disease

## 2015-02-04 ENCOUNTER — Encounter: Payer: Self-pay | Admitting: Cardiovascular Disease

## 2015-02-04 ENCOUNTER — Ambulatory Visit (INDEPENDENT_AMBULATORY_CARE_PROVIDER_SITE_OTHER): Payer: Medicare Other | Admitting: Cardiovascular Disease

## 2015-02-04 VITALS — BP 130/70 | HR 68 | Ht 60.0 in | Wt 141.8 lb

## 2015-02-04 DIAGNOSIS — I5022 Chronic systolic (congestive) heart failure: Secondary | ICD-10-CM

## 2015-02-04 DIAGNOSIS — I2510 Atherosclerotic heart disease of native coronary artery without angina pectoris: Secondary | ICD-10-CM

## 2015-02-04 DIAGNOSIS — E785 Hyperlipidemia, unspecified: Secondary | ICD-10-CM | POA: Diagnosis not present

## 2015-02-04 MED ORDER — ATORVASTATIN CALCIUM 40 MG PO TABS: 40.0000 mg | ORAL_TABLET | Freq: Every day | ORAL | Status: DC

## 2015-02-04 NOTE — Progress Notes (Signed)
HPI  This is a pleasant 81 year old female who is here today for a follow-up visit regarding coronary artery disease. She has known history of hypertension and type 2 diabetes. She has previous history of palpitations controlled with atenolol. She presented in December 2015 with non-ST elevation myocardial infarction with anterior ischemic changes on EKG. She underwent cardiac catheterization which showed 99% stenosis in the mid LAD with ejection fraction of 40%. PCI and drug-eluting stent placement was performed to the LAD without complications. Since then, she denies any chest discomfort or shortness of breath. She did have myalgia with Crestor and was switched to atorvastatin.  She has been doing very well overall with no chest pain or shortness of breath.her main issue seems to be arthritis. She has been tolerating atorvastatin.  Allergies  Allergen Reactions  . Codeine   . Sulfa Antibiotics      Current Outpatient Prescriptions on File Prior to Visit  Medication Sig Dispense Refill  . albuterol (PROVENTIL HFA;VENTOLIN HFA) 108 (90 BASE) MCG/ACT inhaler Inhale 2 puffs into the lungs every 6 (six) hours as needed for wheezing or shortness of breath. 1 Inhaler 0  . aspirin EC 81 MG tablet Take 1 tablet (81 mg total) by mouth daily.    . carvedilol (COREG) 6.25 MG tablet Take 1 tablet (6.25 mg total) by mouth 2 (two) times daily. 180 tablet 3  . clopidogrel (PLAVIX) 75 MG tablet Take 1 tablet (75 mg total) by mouth daily. 90 tablet 3  . D-Ribose (RIBOSE, D,) POWD by Does not apply route daily.    . LevOCARNitine (CARNITINE PO) Take 500 mg by mouth daily.    Marland Kitchen levothyroxine (SYNTHROID, LEVOTHROID) 100 MCG tablet Take 100 mcg by mouth daily.    Marland Kitchen lisinopril (PRINIVIL,ZESTRIL) 20 MG tablet TAKE ONE TABLET BY MOUTH ONCE DAILY 30 tablet 3  . MAGNESIUM CITRATE PO Take 500 mg by mouth daily.    . nitroGLYCERIN (NITROSTAT) 0.4 MG SL tablet Place 0.4 mg under the tongue every 5 (five) minutes as  needed for chest pain.     No current facility-administered medications on file prior to visit.     Past Medical History  Diagnosis Date  . Diabetes mellitus without complication ([place])   . Hypertension   . Hypothyroidism   . MI (myocardial infarction) ([place])   . Hyperlipidemia   . Chronic systolic heart failure (HCC)     Due to ischemic cardiomyopathy with ejection fraction of 40%  . Coronary artery disease     Non-ST elevation myocardial infarction in December 2015. Cardiac catheterization showed 99% mid LAD stenosis, ejection fraction of 40% with moderate anterolateral and apical hypokinesis. She underwent PCI and drug-eluting stent placement to the mid LAD (2.5 x 33 mm Xience drug-eluting stent) . There was also 50% distal left circumflex stenosis.     Past Surgical History  Procedure Laterality Date  . Cardiac catheterization  03/16/2014    x1 stent  . Total knee arthroplasty Left   . Shoulder surgery    . Carpal tunnel release    . Total abdominal hysterectomy    . Tonsillectomy       Family History  Problem Relation Age of Onset  . Heart attack Father 65  . Heart attack Brother 57     Social History   Social History  . Marital Status: Married    Spouse Name: N/A  . Number of Children: N/A  . Years of Education: N/A   Occupational History  .  Not on file.   Social History Main Topics  . Smoking status: Never Smoker   . Smokeless tobacco: Not on file  . Alcohol Use: No  . Drug Use: No  . Sexual Activity: Not on file   Other Topics Concern  . Not on file   Social History Narrative      PHYSICAL EXAM   BP 130/70 mmHg  Pulse 68  Ht 5' (1.524 m)  Wt 141 lb 12 oz (64.297 kg)  BMI 27.68 kg/m2 Constitutional: She is oriented to person, place, and time. She appears well-developed and well-nourished. No distress.  HENT: No nasal discharge.  Head: Normocephalic and atraumatic.  Eyes: Pupils are equal and round. No discharge.  Neck: Normal range of  motion. Neck supple. No JVD present. No thyromegaly present.  Cardiovascular: Normal rate, regular rhythm, normal heart sounds. Exam reveals no gallop and no friction rub. No murmur heard.  Pulmonary/Chest: Effort normal and breath sounds normal. No stridor. No respiratory distress. She has expiratory wheezing with bronchial breath sounds at the right base. She has no rales. She exhibits no tenderness.  Abdominal: Soft. Bowel sounds are normal. She exhibits no distension. There is no tenderness. There is no rebound and no guarding.  Musculoskeletal: Normal range of motion. She exhibits no edema and no tenderness.  Neurological: She is alert and oriented to person, place, and time. Coordination normal.  Skin: Skin is warm and dry. No rash noted. She is not diaphoretic. No erythema. No pallor.  Psychiatric: She has a normal mood and affect. Her behavior is normal. Judgment and thought content normal.        ASSESSMENT AND PLAN

## 2015-02-04 NOTE — Assessment & Plan Note (Signed)
Labs September showed an LDL of 124. I increased the dose of atorvastatin 40 mg once daily.

## 2015-02-04 NOTE — Assessment & Plan Note (Signed)
She is doing very well overall with no symptoms suggestive of angina. Continue medical therapy. I favor dual antiplatelet therapy for 2 years given the long drug-eluting stent in the mid LAD and her presentation with ACS.

## 2015-02-04 NOTE — Assessment & Plan Note (Signed)
She appears to be euvolemic. Blood pressure is optimal. Continue treatment with carvedilol and lisinopril.

## 2015-02-04 NOTE — Patient Instructions (Signed)
Medication Instructions:  Your physician has recommended you make the following change in your medication:  INCREASE atorvastatin to 40mg  once per day   Labwork: none  Testing/Procedures: none  Follow-Up: Your physician wants you to follow-up in: six months with Dr. Fletcher Anon.  You will receive a reminder letter in the mail two months in advance. If you don't receive a letter, please call our office to schedule the follow-up appointment.   Any Other Special Instructions Will Be Listed Below (If Applicable).     If you need a refill on your cardiac medications before your next appointment, please call your pharmacy.

## 2015-04-16 ENCOUNTER — Other Ambulatory Visit: Payer: Self-pay | Admitting: Cardiovascular Disease

## 2015-04-22 ENCOUNTER — Telehealth: Payer: Self-pay | Admitting: *Deleted

## 2015-04-22 NOTE — Telephone Encounter (Signed)
Pt calling stating 04/17/15 she had a nose biopsy Had some bleeding issues and used a cotton ball to stop the bleeding.  It did stop but she has a question about her medication. Would like to know if she needs to  And if she can stay of  The Plavix   Her PCP already took her off the metformin  Please advise  [phone] home phone but call cell phone first.

## 2015-04-22 NOTE — Telephone Encounter (Signed)
S/w pt who states she had biopsy on her nose 04/17/15 at [facility] in [place]. Had bleeding issues, returned the same day and it was cauterized. She reports no bleeding at this time.  She is being referred to [facility] for further testing on the area. Pt takes aspirin and Plavix and inquires if she should stop it. Advised pt to continue all medications as scheduled and take current list of medication to her [facility] appt.  They can advise her if anything needs to be held prior to procedure or they will contact us. Pt verbalized understanding and is agreeable w/plan.

## 2015-04-29 ENCOUNTER — Other Ambulatory Visit: Payer: Self-pay | Admitting: Cardiovascular Disease

## 2015-05-15 ENCOUNTER — Ambulatory Visit
Admission: EM | Admit: 2015-05-15 | Discharge: 2015-05-15 | Disposition: A | Discharge status: Home / Self Care | Payer: Medicare Other | Attending: Family Medicine | Admitting: Family Medicine

## 2015-05-15 DIAGNOSIS — Z79899 Other long term (current) drug therapy: Secondary | ICD-10-CM | POA: Diagnosis not present

## 2015-05-15 DIAGNOSIS — I2510 Atherosclerotic heart disease of native coronary artery without angina pectoris: Secondary | ICD-10-CM | POA: Diagnosis not present

## 2015-05-15 DIAGNOSIS — I252 Old myocardial infarction: Secondary | ICD-10-CM | POA: Insufficient documentation

## 2015-05-15 DIAGNOSIS — I5022 Chronic systolic (congestive) heart failure: Secondary | ICD-10-CM | POA: Diagnosis not present

## 2015-05-15 DIAGNOSIS — I110 Hypertensive heart disease with heart failure: Secondary | ICD-10-CM | POA: Diagnosis not present

## 2015-05-15 DIAGNOSIS — E119 Type 2 diabetes mellitus without complications: Secondary | ICD-10-CM | POA: Insufficient documentation

## 2015-05-15 DIAGNOSIS — E785 Hyperlipidemia, unspecified: Secondary | ICD-10-CM | POA: Insufficient documentation

## 2015-05-15 DIAGNOSIS — Z7982 Long term (current) use of aspirin: Secondary | ICD-10-CM | POA: Insufficient documentation

## 2015-05-15 DIAGNOSIS — N390 Urinary tract infection, site not specified: Secondary | ICD-10-CM | POA: Insufficient documentation

## 2015-05-15 DIAGNOSIS — E039 Hypothyroidism, unspecified: Secondary | ICD-10-CM | POA: Insufficient documentation

## 2015-05-15 LAB — URINALYSIS COMPLETE WITH MICROSCOPIC (ARMC ONLY)
Bilirubin Urine: NEGATIVE
GLUCOSE, UA: NEGATIVE mg/dL
Ketones, ur: NEGATIVE mg/dL
NITRITE: POSITIVE — AB
PH: 6.5 (ref 5.0–8.0)
PROTEIN: 100 mg/dL — AB
SPECIFIC GRAVITY, URINE: 1.015 (ref 1.005–1.030)
Squamous Epithelial / LPF: NONE SEEN

## 2015-05-15 MED ORDER — CIPROFLOXACIN HCL 500 MG PO TABS
500.0000 mg | ORAL_TABLET | Freq: Two times a day (BID) | ORAL | Status: DC
Start: 2015-05-15 — End: 2015-08-08

## 2015-05-15 MED ORDER — CIPROFLOXACIN HCL 500 MG PO TABS
500.0000 mg | ORAL_TABLET | Freq: Once | ORAL | Status: AC
  Administered 2015-05-15: 500 mg via ORAL

## 2015-05-15 NOTE — ED Provider Notes (Signed)
[facility]  ____________________________________________  Time seen: Approximately 8:16 PM  I have reviewed the triage vital signs and the nursing notes.   HISTORY  Chief Complaint Urinary Tract Infection   HPI Kendra Prince is a 81 y.o. female presents with husband at bedside for the complaints of 2 days of urinary frequency, urinary urgency and lower mid intermittent abdominal pressure with urination. Denies pain. Patient reports that she feels like she is having to urinate much more freely than baseline. Denies abdominal pain, flank pain, lower back pain. States history of chronic back pain, denies current back pain. Denies nausea, vomiting, fever, decreased appetite. Reports continues to eat and drink well.  Reports that she has had a urinary tract infections in the past that felt similar. Denies recent urinary tract infection. Since last urinary tract infection was at least 2 years ago.Denies vaginal pain, vaginal bleeding or vaginal complaints. Denies chest pain, shortness of breath, dizziness, weakness or other complaints.   Denies other pain or complaints.  PCP: Pamella Pert    Past Medical History  Diagnosis Date  . Diabetes mellitus without complication ([place])   . Hypertension   . Hypothyroidism   . MI (myocardial infarction) ([place])   . Hyperlipidemia   . Chronic systolic heart failure (HCC)     Due to ischemic cardiomyopathy with ejection fraction of 40%  . Coronary artery disease     Non-ST elevation myocardial infarction in December 2015. Cardiac catheterization showed 99% mid LAD stenosis, ejection fraction of 40% with moderate anterolateral and apical hypokinesis. She underwent PCI and drug-eluting stent placement to the mid LAD (2.5 x 33 mm Xience drug-eluting stent) . There was also 50% distal left circumflex stenosis.    Patient Active Problem List   Diagnosis Date Noted  . Pneumonia, organism unspecified 05/10/2014  . Hyperlipidemia   . Chronic  systolic heart failure ([place])   . Coronary artery disease     Past Surgical History  Procedure Laterality Date  . Cardiac catheterization  03/16/2014    x1 stent  . Total knee arthroplasty Left   . Shoulder surgery    . Carpal tunnel release    . Total abdominal hysterectomy    . Tonsillectomy      Current Outpatient Rx  Name  Route  Sig  Dispense  Refill  . atorvastatin (LIPITOR) 40 MG tablet   Oral   Take 1 tablet (40 mg total) by mouth daily.   30 tablet   5   . carvedilol (COREG) 6.25 MG tablet      TAKE ONE TABLET BY MOUTH TWICE DAILY   180 tablet   3   . clopidogrel (PLAVIX) 75 MG tablet   Oral   Take 1 tablet (75 mg total) by mouth daily.   90 tablet   3   . levothyroxine (SYNTHROID, LEVOTHROID) 100 MCG tablet   Oral   Take 100 mcg by mouth daily.         Marland Kitchen lisinopril (PRINIVIL,ZESTRIL) 20 MG tablet      TAKE ONE TABLET BY MOUTH ONCE DAILY   30 tablet   3   . albuterol (PROVENTIL HFA;VENTOLIN HFA) 108 (90 BASE) MCG/ACT inhaler   Inhalation   Inhale 2 puffs into the lungs every 6 (six) hours as needed for wheezing or shortness of breath.   1 Inhaler   0   . aspirin EC 81 MG tablet   Oral   Take 1 tablet (81 mg total) by mouth daily.         Marland Kitchen  D-Ribose (RIBOSE, D,) POWD   Does not apply   by Does not apply route daily.         . LevOCARNitine (CARNITINE PO)   Oral   Take 500 mg by mouth daily.         Marland Kitchen MAGNESIUM CITRATE PO   Oral   Take 500 mg by mouth daily.         . nitroGLYCERIN (NITROSTAT) 0.4 MG SL tablet   Sublingual   Place 0.4 mg under the tongue every 5 (five) minutes as needed for chest pain.           Allergies Codeine and Sulfa antibiotics Also reports allergic to azithromycin. Family History  Problem Relation Age of Onset  . Heart attack Father 41  . Heart attack Brother 26    Social History Social History  Substance Use Topics  . Smoking status: Never Smoker   . Smokeless tobacco: None  . Alcohol  Use: No    Review of Systems Constitutional: No fever/chills Eyes: No visual changes. ENT: No sore throat. Cardiovascular: Denies chest pain. Respiratory: Denies shortness of breath. Gastrointestinal: No abdominal pain.  No nausea, no vomiting.  No diarrhea.  No constipation. Genitourinary: Positive for dysuria. Musculoskeletal: Negative for back pain. Skin: Negative for rash. Neurological: Negative for headaches, focal weakness or numbness.  10-point ROS otherwise negative.  ____________________________________________   PHYSICAL EXAM:  VITAL SIGNS: ED Triage Vitals  Enc Vitals Group     BP 05/15/15 1945 166/86 mmHg     Pulse Rate 05/15/15 1945 88     Resp 05/15/15 1945 16     Temp 05/15/15 1945 97.7 F (36.5 C)     Temp Source 05/15/15 1945 Tympanic     SpO2 05/15/15 1945 98 %     Weight 05/15/15 1945 142 lb (64.411 kg)     Height 05/15/15 1945 5' (1.524 m)     Head Cir --      Peak Flow --      Pain Score 05/15/15 1935 3     Pain Loc --      Pain Edu? --      Excl. in [place]? --     Constitutional: Alert and oriented. Well appearing and in no acute distress. Eyes: Conjunctivae are normal. PERRL. EOMI. Head: Atraumatic.  Nose: No congestion/rhinnorhea.  Mouth/Throat: Mucous membranes are moist.  Oropharynx non-erythematous. Neck: No stridor.  No cervical spine tenderness to palpation. Hematological/Lymphatic/Immunilogical: No cervical lymphadenopathy. Cardiovascular: Normal rate, regular rhythm. Grossly normal heart sounds.  Good peripheral circulation. Respiratory: Normal respiratory effort.  No retractions. Lungs CTAB. Gastrointestinal: Soft and nontender. No distention. Normal Bowel sounds. No CVA tenderness.  Musculoskeletal: No lower or upper extremity tenderness nor edema. No cervical, thoracic or lumbar tenderness to palpation. Neurologic:  Normal speech and language. No gross focal neurologic deficits are appreciated. No gait instability. Skin:  Skin is  warm, dry and intact. No rash noted. Psychiatric: Mood and affect are normal. Speech and behavior are normal.  ____________________________________________   LABS (all labs ordered are listed, but only abnormal results are displayed)  Labs Reviewed  URINALYSIS COMPLETEWITH MICROSCOPIC ([place] ONLY) - Abnormal; Notable for the following:    APPearance CLOUDY (*)    Hgb urine dipstick 3+ (*)    Protein, ur 100 (*)    Nitrite POSITIVE (*)    Leukocytes, UA 2+ (*)    Bacteria, UA FEW (*)    All other components within normal limits  URINE CULTURE  INITIAL IMPRESSION / ASSESSMENT AND PLAN / ED COURSE  Pertinent labs & imaging results that were available during my care of the patient were reviewed by me and considered in my medical decision making (see chart for details).  Very well-appearing patient. No acute distress. Presents with complaints of 2 days of dysuria. Denies ear. Denies abdominal pain, flank pain, appetite changes. Denies nausea, vomiting diarrhea. Denies other complaints. Reports continues to eat and drink well. Urinalysis positive for few bacteria, 2+ leukocytes, nitrite positive, cloudy, 3+ hemoglobin, will culture urine. Will treat patient with oral Cipro 7 days. Encourage close follow-up with PCP. Encourage follow-up within one week to ensure urinary tract infection clearance. Encourage adequate hydration and close monitoring of symptoms.  Discussed follow up with Primary care physician this week. Discussed follow up and return parameters to urgent care or ER including abdominal pain, back pain, fever, vomiting, weakness, abnormal pain, no resolution or any worsening concerns. Patient verbalized understanding and agreed to plan.   ____________________________________________   FINAL CLINICAL IMPRESSION(S) / ED DIAGNOSES  Final diagnoses:  UTI (lower urinary tract infection)      Note: This dictation was prepared with Dragon dictation along with smaller phrase  technology. Any transcriptional errors that result from this process are unintentional.    Marylene Land, NP 05/15/15 2052

## 2015-05-15 NOTE — Discharge Instructions (Signed)
Take medication as prescribed. Rest. Drink plenty of fluids.   Follow up with your primary care physician this week. Return to Urgent care for new or worsening concerns.    Urinary Tract Infection A urinary tract infection (UTI) can occur any place along the urinary tract. The tract includes the kidneys, ureters, bladder, and urethra. A type of germ called bacteria often causes a UTI. UTIs are often helped with antibiotic medicine.  HOME CARE   If given, take antibiotics as told by your doctor. Finish them even if you start to feel better.  Drink enough fluids to keep your pee (urine) clear or pale yellow.  Avoid tea, drinks with caffeine, and bubbly (carbonated) drinks.  Pee often. Avoid holding your pee in for a long time.  Pee before and after having sex (intercourse).  Wipe from front to back after you poop (bowel movement) if you are a woman. Use each tissue only once. GET HELP RIGHT AWAY IF:   You have back pain.  You have lower belly (abdominal) pain.  You have chills.  You feel sick to your stomach (nauseous).  You throw up (vomit).  Your burning or discomfort with peeing does not go away.  You have a fever.  Your symptoms are not better in 3 days. MAKE SURE YOU:   Understand these instructions.  Will watch your condition.  Will get help right away if you are not doing well or get worse.   This information is not intended to replace advice given to you by your health care provider. Make sure you discuss any questions you have with your health care provider.   Document Released: 09/02/2007 Document Revised: 04/06/2014 Document Reviewed: 10/15/2011 Elsevier Interactive Patient Education [employer].

## 2015-05-15 NOTE — ED Notes (Signed)
Started yesterday with urinary frequency and pressure. Also low abdominal pain

## 2015-05-18 ENCOUNTER — Telehealth: Payer: Self-pay

## 2015-05-18 LAB — URINE CULTURE: SPECIAL REQUESTS: NORMAL

## 2015-05-18 NOTE — ED Notes (Signed)
Patient contacted and informed that urine was + E-Coli. On Cipro since seen at [facility] and states "starting to feel better". States has appointment with PMD on 05/28/15 and suggested that may need a repeat urine done at that time. Instructed to return or go to ER for any worsening symptoms

## 2015-07-10 ENCOUNTER — Other Ambulatory Visit: Payer: Self-pay | Admitting: Cardiovascular Disease

## 2015-08-08 ENCOUNTER — Ambulatory Visit (INDEPENDENT_AMBULATORY_CARE_PROVIDER_SITE_OTHER): Payer: Medicare Other | Admitting: Cardiovascular Disease

## 2015-08-08 ENCOUNTER — Encounter: Payer: Self-pay | Admitting: Cardiovascular Disease

## 2015-08-08 VITALS — BP 162/60 | HR 65 | Ht 60.0 in | Wt 144.5 lb

## 2015-08-08 DIAGNOSIS — I2510 Atherosclerotic heart disease of native coronary artery without angina pectoris: Secondary | ICD-10-CM | POA: Diagnosis not present

## 2015-08-08 DIAGNOSIS — I5022 Chronic systolic (congestive) heart failure: Secondary | ICD-10-CM

## 2015-08-08 MED ORDER — LISINOPRIL 40 MG PO TABS: 40.0000 mg | ORAL_TABLET | Freq: Every day | ORAL | Status: DC

## 2015-08-08 MED ORDER — PRAVASTATIN SODIUM 40 MG PO TABS: 40.0000 mg | ORAL_TABLET | Freq: Every evening | ORAL | Status: DC

## 2015-08-08 NOTE — Patient Instructions (Signed)
Medication Instructions:  Your physician has recommended you make the following change in your medication:  INCREASE lisinopril to 40mg  once daily START taking pravastatin 40mg  once daily   Labwork: Fasting lipid and liver in 6 weeks. Nothing to eat or drink the morning of your labs  Testing/Procedures: none  Follow-Up: Your physician wants you to follow-up in: six months. You will receive a reminder letter in the mail two months in advance. If you don't receive a letter, please call our office to schedule the follow-up appointment.   Any Other Special Instructions Will Be Listed Below (If Applicable).     If you need a refill on your cardiac medications before your next appointment, please call your pharmacy.

## 2015-08-08 NOTE — Progress Notes (Signed)
Cardiology Office Note   Date:  08/08/2015   ID:  EYMI CORRIVEAU, DOB 1931-05-21, MRN [id number]  PCP:  Gearldine Shown, DO  Cardiologist:   Kathlyn Sacramento, MD   Chief Complaint  Patient presents with  . other    6 month f/u. Meds reviewed verbally.      History of Present Illness: Kendra Prince is a 82 y.o. female who presents for  a follow-up visit regarding coronary artery disease. She has known history of hypertension and type 2 diabetes. She has previous history of palpitations controlled with atenolol. She presented in December 2015 with non-ST elevation myocardial infarction with anterior ischemic changes on EKG. She underwent cardiac catheterization which showed 99% stenosis in the mid LAD with ejection fraction of 40%. PCI and drug-eluting stent placement was performed to the LAD without complications. Since then, she denies any chest discomfort or shortness of breath. She did have myalgia with Crestor and  atorvastatin.  She has been doing very well overall with no chest pain or shortness of breath. Her main issue seems to be arthritis.   She is currently not on any statin.  Past Medical History  Diagnosis Date  . Diabetes mellitus without complication ([place])   . Hypertension   . Hypothyroidism   . MI (myocardial infarction) ([place])   . Hyperlipidemia   . Chronic systolic heart failure (HCC)     Due to ischemic cardiomyopathy with ejection fraction of 40%  . Coronary artery disease     Non-ST elevation myocardial infarction in December 2015. Cardiac catheterization showed 99% mid LAD stenosis, ejection fraction of 40% with moderate anterolateral and apical hypokinesis. She underwent PCI and drug-eluting stent placement to the mid LAD (2.5 x 33 mm Xience drug-eluting stent) . There was also 50% distal left circumflex stenosis.    Past Surgical History  Procedure Laterality Date  . Cardiac catheterization  03/16/2014    x1 stent  . Total knee  arthroplasty Left   . Shoulder surgery    . Carpal tunnel release    . Total abdominal hysterectomy    . Tonsillectomy    . Mohs surgery       Current Outpatient Prescriptions  Medication Sig Dispense Refill  . albuterol (PROVENTIL HFA;VENTOLIN HFA) 108 (90 BASE) MCG/ACT inhaler Inhale 2 puffs into the lungs every 6 (six) hours as needed for wheezing or shortness of breath. 1 Inhaler 0  . ARGININE PO Take by mouth daily.    Marland Kitchen aspirin EC 81 MG tablet Take 1 tablet (81 mg total) by mouth daily.    . carvedilol (COREG) 6.25 MG tablet TAKE ONE TABLET BY MOUTH TWICE DAILY 180 tablet 3  . Cholecalciferol (SM VITAMIN D3) 4000 units CAPS Take by mouth daily.    . clopidogrel (PLAVIX) 75 MG tablet TAKE ONE TABLET BY MOUTH ONCE DAILY 90 tablet 3  . LevOCARNitine (CARNITINE PO) Take 500 mg by mouth daily. Reported on 08/08/2015    . levothyroxine (SYNTHROID, LEVOTHROID) 100 MCG tablet Take 100 mcg by mouth daily.    Marland Kitchen lisinopril (PRINIVIL,ZESTRIL) 20 MG tablet TAKE ONE TABLET BY MOUTH ONCE DAILY 30 tablet 3  . MAGNESIUM CITRATE PO Take 500 mg by mouth daily.    . nitroGLYCERIN (NITROSTAT) 0.4 MG SL tablet Place 0.4 mg under the tongue every 5 (five) minutes as needed for chest pain.    . Omega-3 Fatty Acids (OMEGA-3 PLUS) 1000 MG CAPS Take by mouth 2 (two) times daily.  No current facility-administered medications for this visit.    Allergies:   Azithromycin; Codeine; Statins; Sulfa antibiotics; and Tape    Social History:  The patient  reports that she has never smoked. She does not have any smokeless tobacco history on file. She reports that she does not drink alcohol or use illicit drugs.   Family History:  The patient's family history includes Heart attack (age of onset: 53) in her brother; Heart attack (age of onset: 65) in her father.    ROS:  Please see the history of present illness.   Otherwise, review of systems are positive for none.   All other systems are reviewed and negative.     PHYSICAL EXAM: VS:  BP 162/60 mmHg  Pulse 65  Ht 5' (1.524 m)  Wt 144 lb 8 oz (65.545 kg)  BMI 28.22 kg/m2 , BMI Body mass index is 28.22 kg/(m^2). GEN: Well nourished, well developed, in no acute distress HEENT: normal Neck: no JVD, carotid bruits, or masses Cardiac: RRR; no murmurs, rubs, or gallops,no edema  Respiratory:  clear to auscultation bilaterally, normal work of breathing GI: soft, nontender, nondistended, + BS MS: no deformity or atrophy Skin: warm and dry, no rash Neuro:  Strength and sensation are intact Psych: euthymic mood, full affect   EKG:  EKG is ordered today. The ekg ordered today demonstrates normal sinus rhythm with low voltage. No significant ST or T wave changes.   Recent Labs: No results found for requested labs within last 365 days.    Lipid Panel    Component Value Date/Time   CHOL 145 03/16/2014 0538   TRIG 173 03/16/2014 0538   HDL 40 03/16/2014 0538   VLDL 35 03/16/2014 0538   LDLCALC 70 03/16/2014 0538      Wt Readings from Last 3 Encounters:  08/08/15 144 lb 8 oz (65.545 kg)  05/15/15 142 lb (64.411 kg)  02/04/15 141 lb 12 oz (64.297 kg)        ASSESSMENT AND PLAN:  1.  Coronary artery disease involving native coronary arteries without angina: She is stable from a cardiac standpoint. Continue dual antiplatelet therapy until December 2017.  2. Hyperlipidemia: She had myalgia with rosuvastatin and atorvastatin. She had a recent lipid profile which showed a total cholesterol of 219, TG: 150, HDL of 40 and triglyceride of 145. I elected to start her on pravastatin 40 mg once daily. Recheck labs in 4-6 weeks. If she does not tolerate this, then I recommend Zetia 10 mg once daily.  3. Essential hypertension: Blood pressure continues to be mildly elevated. I increased the dose of fosinopril to 40 mg once daily.  4. Chronic systolic heart failure: Ejection fraction was 40% of the time of myocardial infarction. She is euvolemic  without any diuretics. Continue treatment with carvedilol and lisinopril.    Disposition:   FU with me in 6 months  Signed,  Kathlyn Sacramento, MD  08/08/2015 10:13 AM    [place]

## 2015-08-16 ENCOUNTER — Telehealth: Payer: Self-pay | Admitting: Cardiovascular Disease

## 2015-08-16 MED ORDER — EZETIMIBE 10 MG PO TABS: 10.0000 mg | ORAL_TABLET | Freq: Every day | ORAL | Status: DC

## 2015-08-16 NOTE — Telephone Encounter (Signed)
Left detailed voicemail message for patient to stop taking the pravastatin and we are starting her on Zetia 10 mg daily which should help with her symptoms and instructions to call back to discuss change.

## 2015-08-16 NOTE — Telephone Encounter (Signed)
Spoke with patient and let her know that we called in a new prescription for zetia 10 mg daily and to stop the pravastatin. She verbalized understanding and had no further questions.

## 2015-08-16 NOTE — Telephone Encounter (Signed)
Pt c/o medication issue:  1. Name of Medication: Pravastatin   2. How are you currently taking this medication (dosage and times per day)? 40 mg po daily   3. Are you having a reaction (difficulty breathing--STAT)? Yes  -  Calf muscles quiver in the morning and has bad muscle leg - hip cramps   frequent urination upset stomach bloated gas   4. What is your medication issue? Is there anything else besides statin to try patient cannot take this any more   Please call in new rx to [facility] [address] [place] [facility]

## 2015-08-16 NOTE — Telephone Encounter (Signed)
Stop Pravastatin. Start Zetia 10 mg daily which is not a statin.

## 2015-09-20 ENCOUNTER — Other Ambulatory Visit (INDEPENDENT_AMBULATORY_CARE_PROVIDER_SITE_OTHER): Payer: Medicare Other

## 2015-09-20 DIAGNOSIS — I2510 Atherosclerotic heart disease of native coronary artery without angina pectoris: Secondary | ICD-10-CM

## 2015-09-21 LAB — HEPATIC FUNCTION PANEL
ALBUMIN: 4.5 g/dL (ref 3.5–4.7)
ALT: 12 IU/L (ref 0–32)
AST: 21 IU/L (ref 0–40)
Alkaline Phosphatase: 64 IU/L (ref 39–117)
BILIRUBIN TOTAL: 0.6 mg/dL (ref 0.0–1.2)
BILIRUBIN, DIRECT: 0.13 mg/dL (ref 0.00–0.40)
Total Protein: 6.6 g/dL (ref 6.0–8.5)

## 2015-09-21 LAB — LIPID PANEL
CHOL/HDL RATIO: 4.5 ratio — AB (ref 0.0–4.4)
CHOLESTEROL TOTAL: 200 mg/dL — AB (ref 100–199)
HDL: 44 mg/dL (ref >39)
LDL CALC: 125 mg/dL — AB (ref 0–99)
TRIGLYCERIDES: 156 mg/dL — AB (ref 0–149)
VLDL Cholesterol Cal: 31 mg/dL (ref 5–40)

## 2015-11-07 DIAGNOSIS — C4370 Malignant melanoma of unspecified lower limb, including hip: Secondary | ICD-10-CM | POA: Insufficient documentation

## 2015-12-02 ENCOUNTER — Other Ambulatory Visit: Payer: Self-pay | Admitting: Cardiovascular Disease

## 2016-02-11 ENCOUNTER — Ambulatory Visit (INDEPENDENT_AMBULATORY_CARE_PROVIDER_SITE_OTHER): Payer: Medicare Other | Admitting: Cardiovascular Disease

## 2016-02-11 ENCOUNTER — Encounter: Payer: Self-pay | Admitting: Cardiovascular Disease

## 2016-02-11 VITALS — BP 170/80 | HR 69 | Ht 60.0 in | Wt 137.0 lb

## 2016-02-11 DIAGNOSIS — I10 Essential (primary) hypertension: Secondary | ICD-10-CM

## 2016-02-11 DIAGNOSIS — I2510 Atherosclerotic heart disease of native coronary artery without angina pectoris: Secondary | ICD-10-CM

## 2016-02-11 DIAGNOSIS — E7800 Pure hypercholesterolemia, unspecified: Secondary | ICD-10-CM | POA: Diagnosis not present

## 2016-02-11 DIAGNOSIS — I5022 Chronic systolic (congestive) heart failure: Secondary | ICD-10-CM

## 2016-02-11 MED ORDER — AMLODIPINE BESYLATE 5 MG PO TABS: 5.0000 mg | ORAL_TABLET | Freq: Every day | ORAL | 3 refills | Status: DC

## 2016-02-11 MED ORDER — ASPIRIN EC 81 MG PO TBEC: 81.0000 mg | DELAYED_RELEASE_TABLET | Freq: Every day | ORAL | 3 refills | Status: DC

## 2016-02-11 NOTE — Patient Instructions (Signed)
Medication Instructions:  Your physician has recommended you make the following change in your medication:  STOP taking plavix START taking aspirin 81mg  once daily START taking amlodipine 5mg  once daily   Labwork: none  Testing/Procedures: none  Follow-Up: Your physician wants you to follow-up in: six months with Dr. Fletcher Anon.  You will receive a reminder letter in the mail two months in advance. If you don't receive a letter, please call our office to schedule the follow-up appointment.   Any Other Special Instructions Will Be Listed Below (If Applicable).     If you need a refill on your cardiac medications before your next appointment, please call your pharmacy.

## 2016-02-11 NOTE — Progress Notes (Signed)
Cardiology Office Note   Date:  02/11/2016   ID:  Kendra Prince, DOB 1931-08-05, MRN [id number]  PCP:  Gearldine Shown, DO  Cardiologist:   Kathlyn Sacramento, MD   Chief Complaint  Patient presents with  . other    6 month f/u no complaints today discuss ibuprofen. Meds reviewed verbally with pt.      History of Present Illness: Kendra Prince is a 82 y.o. female who presents for  a follow-up visit regarding coronary artery disease. She has known history of hypertension and type 2 diabetes. She has previous history of palpitations controlled with atenolol. She presented in December 2015 with non-ST elevation myocardial infarction with anterior ischemic changes on EKG. She underwent cardiac catheterization which showed 99% stenosis in the mid LAD with ejection fraction of 40%. PCI and drug-eluting stent placement was performed to the LAD without complications.  She has known history of hyperlipidemia with known intolerance to all statins. During last visit, I prescribed Zetia but she could not afford the medication. She has been trying to be more careful with her diet. She denies any chest pain or shortness of breath. She describes chronic lower back pain which leads to flare up. She did not sleep well last night and her blood pressure is elevated today. Her blood pressure readings have been going up recently even at her primary care physician's office.  Past Medical History:  Diagnosis Date  . Chronic systolic heart failure (HCC)    Due to ischemic cardiomyopathy with ejection fraction of 40%  . Coronary artery disease    Non-ST elevation myocardial infarction in December 2015. Cardiac catheterization showed 99% mid LAD stenosis, ejection fraction of 40% with moderate anterolateral and apical hypokinesis. She underwent PCI and drug-eluting stent placement to the mid LAD (2.5 x 33 mm Xience drug-eluting stent) . There was also 50% distal left circumflex stenosis.  .  Diabetes mellitus without complication ([place])   . Hyperlipidemia   . Hypertension   . Hypothyroidism   . MI (myocardial infarction)     Past Surgical History:  Procedure Laterality Date  . CARDIAC CATHETERIZATION  03/16/2014   x1 stent  . CARPAL TUNNEL RELEASE    . MOHS SURGERY    . SHOULDER SURGERY    . TONSILLECTOMY    . TOTAL ABDOMINAL HYSTERECTOMY    . TOTAL KNEE ARTHROPLASTY Left      Current Outpatient Prescriptions  Medication Sig Dispense Refill  . albuterol (PROVENTIL HFA;VENTOLIN HFA) 108 (90 BASE) MCG/ACT inhaler Inhale 2 puffs into the lungs every 6 (six) hours as needed for wheezing or shortness of breath. 1 Inhaler 0  . ARGININE PO Take by mouth daily.    . carvedilol (COREG) 6.25 MG tablet TAKE ONE TABLET BY MOUTH TWICE DAILY 180 tablet 3  . Cholecalciferol (SM VITAMIN D3) 4000 units CAPS Take by mouth daily.    . clopidogrel (PLAVIX) 75 MG tablet TAKE ONE TABLET BY MOUTH ONCE DAILY 90 tablet 3  . ezetimibe (ZETIA) 10 MG tablet Take 1 tablet (10 mg total) by mouth daily. 30 tablet 6  . LevOCARNitine (CARNITINE PO) Take 500 mg by mouth daily. Reported on 08/08/2015    . levothyroxine (SYNTHROID, LEVOTHROID) 100 MCG tablet Take 100 mcg by mouth daily.    Marland Kitchen lisinopril (PRINIVIL,ZESTRIL) 40 MG tablet TAKE ONE TABLET BY MOUTH ONCE DAILY 30 tablet 3  . MAGNESIUM CITRATE PO Take 500 mg by mouth daily.    . nitroGLYCERIN (NITROSTAT) 0.4  MG SL tablet Place 0.4 mg under the tongue every 5 (five) minutes as needed for chest pain.    . NON FORMULARY Magnesium Taurate takes 1 tablet daily.    . Omega-3 Fatty Acids (OMEGA-3 PLUS) 1000 MG CAPS Take by mouth 2 (two) times daily.     No current facility-administered medications for this visit.     Allergies:   Azithromycin; Codeine; Statins; Sulfa antibiotics; and Tape    Social History:  The patient  reports that she has never smoked. She has never used smokeless tobacco. She reports that she does not drink alcohol or use  drugs.   Family History:  The patient's family history includes Heart attack (age of onset: 77) in her brother; Heart attack (age of onset: 75) in her father.    ROS:  Please see the history of present illness.   Otherwise, review of systems are positive for none.   All other systems are reviewed and negative.    PHYSICAL EXAM: VS:  BP (!) 170/80 (BP Location: Left Arm, Patient Position: Sitting, Cuff Size: Normal)   Pulse 69   Ht 5' (1.524 m)   Wt 137 lb (62.1 kg)   BMI 26.76 kg/m  , BMI Body mass index is 26.76 kg/m. GEN: Well nourished, well developed, in no acute distress  HEENT: normal  Neck: no JVD, carotid bruits, or masses Cardiac: RRR; no murmurs, rubs, or gallops,no edema  Respiratory:  clear to auscultation bilaterally, normal work of breathing GI: soft, nontender, nondistended, + BS MS: no deformity or atrophy  Skin: warm and dry, no rash Neuro:  Strength and sensation are intact Psych: euthymic mood, full affect   EKG:  EKG is ordered today. The ekg ordered today demonstrates normal sinus rhythm with  incomplete left bundle branch block which is new.  Recent Labs: 09/20/2015: ALT 12    Lipid Panel    Component Value Date/Time   CHOL 200 (H) 09/20/2015 0928   CHOL 145 03/16/2014 0538   TRIG 156 (H) 09/20/2015 0928   TRIG 173 03/16/2014 0538   HDL 44 09/20/2015 0928   HDL 40 03/16/2014 0538   CHOLHDL 4.5 (H) 09/20/2015 0928   VLDL 35 03/16/2014 0538   LDLCALC 125 (H) 09/20/2015 0928   LDLCALC 70 03/16/2014 0538      Wt Readings from Last 3 Encounters:  02/11/16 137 lb (62.1 kg)  08/08/15 144 lb 8 oz (65.5 kg)  05/15/15 142 lb (64.4 kg)        ASSESSMENT AND PLAN:  1.  Coronary artery disease involving native coronary arteries without angina:  It has been almost 2 years since her myocardial infarction and stent placement. Thus, I discontinued clopidogrel. I asked her to resume aspirin 81 mg once daily.  2. Hyperlipidemia:  Unfortunately, she is  intolerant to all statins. She could not afford Zetia.  3. Essential hypertension:  Blood pressure is significantly elevated today. I do not recommend increasing the dose of carvedilol due to heart rate being on the low side with incomplete left bundle branch block. I elected to add amlodipine 5 mg once daily.  4. Chronic systolic heart failure: Ejection fraction was 40% of the time of myocardial infarction. She is euvolemic without any diuretics. Continue treatment with carvedilol and lisinopril.    Disposition:   FU with me in 6 months  Signed,  Kathlyn Sacramento, MD  02/11/2016 2:50 PM    [facility]

## 2016-02-19 ENCOUNTER — Telehealth: Payer: Self-pay | Admitting: Interventional Cardiology

## 2016-02-19 DIAGNOSIS — I5022 Chronic systolic (congestive) heart failure: Secondary | ICD-10-CM | POA: Diagnosis not present

## 2016-02-19 DIAGNOSIS — E119 Type 2 diabetes mellitus without complications: Secondary | ICD-10-CM | POA: Insufficient documentation

## 2016-02-19 DIAGNOSIS — R232 Flushing: Secondary | ICD-10-CM | POA: Diagnosis not present

## 2016-02-19 DIAGNOSIS — Z7982 Long term (current) use of aspirin: Secondary | ICD-10-CM | POA: Insufficient documentation

## 2016-02-19 DIAGNOSIS — T447X5A Adverse effect of beta-adrenoreceptor antagonists, initial encounter: Secondary | ICD-10-CM | POA: Insufficient documentation

## 2016-02-19 DIAGNOSIS — I2510 Atherosclerotic heart disease of native coronary artery without angina pectoris: Secondary | ICD-10-CM | POA: Diagnosis not present

## 2016-02-19 DIAGNOSIS — I110 Hypertensive heart disease with heart failure: Secondary | ICD-10-CM | POA: Insufficient documentation

## 2016-02-19 DIAGNOSIS — R002 Palpitations: Secondary | ICD-10-CM | POA: Diagnosis not present

## 2016-02-19 DIAGNOSIS — R000 Tachycardia, unspecified: Secondary | ICD-10-CM | POA: Diagnosis present

## 2016-02-19 DIAGNOSIS — E039 Hypothyroidism, unspecified: Secondary | ICD-10-CM | POA: Diagnosis not present

## 2016-02-19 LAB — URINALYSIS COMPLETE WITH MICROSCOPIC (ARMC ONLY)
BILIRUBIN URINE: NEGATIVE
Glucose, UA: NEGATIVE mg/dL
Hgb urine dipstick: NEGATIVE
Ketones, ur: NEGATIVE mg/dL
Leukocytes, UA: NEGATIVE
Nitrite: NEGATIVE
PH: 7 (ref 5.0–8.0)
PROTEIN: NEGATIVE mg/dL
Specific Gravity, Urine: 1.006 (ref 1.005–1.030)

## 2016-02-19 LAB — CBC
HCT: 40.1 % (ref 35.0–47.0)
HEMOGLOBIN: 14.2 g/dL (ref 12.0–16.0)
MCH: 31.9 pg (ref 26.0–34.0)
MCHC: 35.5 g/dL (ref 32.0–36.0)
MCV: 89.7 fL (ref 80.0–100.0)
PLATELETS: 264 10*3/uL (ref 150–440)
RBC: 4.47 MIL/uL (ref 3.80–5.20)
RDW: 13.1 % (ref 11.5–14.5)
WBC: 11.4 10*3/uL — ABNORMAL HIGH (ref 3.6–11.0)

## 2016-02-19 LAB — BASIC METABOLIC PANEL
Anion gap: 9 (ref 5–15)
BUN: 14 mg/dL (ref 6–20)
CHLORIDE: 99 mmol/L — AB (ref 101–111)
CO2: 24 mmol/L (ref 22–32)
CREATININE: 0.6 mg/dL (ref 0.44–1.00)
Calcium: 9.5 mg/dL (ref 8.9–10.3)
GFR calc Af Amer: >60 mL/min (ref >60)
GFR calc non Af Amer: >60 mL/min (ref >60)
GLUCOSE: 155 mg/dL — AB (ref 65–99)
Potassium: 4.4 mmol/L (ref 3.5–5.1)
SODIUM: 132 mmol/L — AB (ref 135–145)

## 2016-02-19 LAB — TROPONIN I

## 2016-02-19 NOTE — Telephone Encounter (Signed)
    She has started on amlodipine a week ago.  Tonight, she notes that her HR is increased.  She is shaking and muscle twitching.  She had this in the past before she took atenolol.  She had some sharp pains in her head yesterday after bending down.    She is concerned she has an allergy to amlodipine.  She denies chest pain, SHOB or rash at this time.  She does not feel that she has to go to the ER.   I don't think this is coming from the amlodipine.  I asked her to hold the amlodipine tomorrow and check her BP.  If she is back to normal, and BP is > 150/90, she can try the amlodipine again.    Her HR is faster, running around 120.  She wanted to take some old atenolol she had.  I advised against this due to Dr. Tyrell Antonio comments about increasing carvedilol.  She will recheck her BP later and consider being seen in the ER if she feels worse.  I explained that is the only way to get an ECG since the offices will be closed until Monday.  She will discuss with her daughter and decide whether she will go.   Jettie Booze, MD

## 2016-02-19 NOTE — ED Triage Notes (Signed)
Patient ambulatory to triage with steady gait, without difficulty or distress noted; pt reports med change week ago; change from carvedilol to norvasc due to uncontrolled BP; noted heart racing, felt shaky and face hot that last 2.5hrs; took 1/2 atenolol that seemed to alleviate symptoms; has noted slight increase to LE(s) as well; st at present "feels tired"

## 2016-02-19 NOTE — ED Notes (Signed)
Pt's daughter [phone] Angela Nevin, left phone number for when pt ready to be d/c

## 2016-02-20 ENCOUNTER — Emergency Department
Admission: EM | Admit: 2016-02-20 | Discharge: 2016-02-20 | Disposition: A | Discharge status: Home / Self Care | Payer: Medicare Other | Attending: Emergency Medicine | Admitting: Emergency Medicine

## 2016-02-20 DIAGNOSIS — T50905A Adverse effect of unspecified drugs, medicaments and biological substances, initial encounter: Secondary | ICD-10-CM

## 2016-02-20 NOTE — ED Notes (Signed)
Discharge instructions reviewed with patient. Questions fielded by this RN. Patient verbalizes understanding of instructions. Patient discharged home in stable condition per Brown MD . No acute distress noted at time of discharge.   

## 2016-02-20 NOTE — ED Notes (Signed)
Pt states noted heart racing around 1930, felt shaky and face hot. pt states med change week ago.  coreg to Norvasc due to uncontrolled BP. Pt  took 1/2 atenolol that seemed to alleviate symptoms. Pt states no racing HR, no CP or SHOB just feels like shaky. Pt states has noted slight increase to swelling. No distress noted. Breathing even and unlabored. Will continue to monitor. Dtr at the bedside.

## 2016-02-20 NOTE — ED Provider Notes (Signed)
[facility] Emergency Department Provider Note  ____________________________________________   First MD Initiated Contact with Patient 02/20/16 0111     (approximate)  I have reviewed the triage vital signs and the nursing notes.   HISTORY  Chief Complaint Tachycardia    HPI Kendra Prince is a 82 y.o. female presents to the emergency department with history of sensation that her heart was racing at home, "feeling shaky, flushing of her face that lasted approximately 2-1/2 hours. Patient states that she took atenolol which alleviated her symptoms. Of note patient states that she was recently switched to Norvasc and has noted onset of symptoms since that time. Patient denies any chest pain no shortness of breath no nausea or vomiting at this time.   Past Medical History:  Diagnosis Date  . Chronic systolic heart failure (HCC)    Due to ischemic cardiomyopathy with ejection fraction of 40%  . Coronary artery disease    Non-ST elevation myocardial infarction in December 2015. Cardiac catheterization showed 99% mid LAD stenosis, ejection fraction of 40% with moderate anterolateral and apical hypokinesis. She underwent PCI and drug-eluting stent placement to the mid LAD (2.5 x 33 mm Xience drug-eluting stent) . There was also 50% distal left circumflex stenosis.  . Diabetes mellitus without complication ([place])   . Hyperlipidemia   . Hypertension   . Hypothyroidism   . MI (myocardial infarction)     Patient Active Problem List   Diagnosis Date Noted  . Pneumonia, organism unspecified(486) 05/10/2014  . Hyperlipidemia   . Chronic systolic heart failure ([place])   . Coronary artery disease     Past Surgical History:  Procedure Laterality Date  . CARDIAC CATHETERIZATION  03/16/2014   x1 stent  . CARPAL TUNNEL RELEASE    . MOHS SURGERY    . SHOULDER SURGERY    . TONSILLECTOMY    . TOTAL ABDOMINAL HYSTERECTOMY    . TOTAL KNEE ARTHROPLASTY Left      Prior to Admission medications   Medication Sig Start Date End Date Taking? Authorizing Provider  albuterol (PROVENTIL HFA;VENTOLIN HFA) 108 (90 BASE) MCG/ACT inhaler Inhale 2 puffs into the lungs every 6 (six) hours as needed for wheezing or shortness of breath. 05/07/14   Wellington Hampshire, MD  amLODipine (NORVASC) 5 MG tablet Take 1 tablet (5 mg total) by mouth daily. 02/11/16 05/11/16  Wellington Hampshire, MD  ARGININE PO Take by mouth daily.    Historical Provider, MD  aspirin EC 81 MG tablet Take 1 tablet (81 mg total) by mouth daily. 02/11/16   Wellington Hampshire, MD  carvedilol (COREG) 6.25 MG tablet TAKE ONE TABLET BY MOUTH TWICE DAILY 04/16/15   Wellington Hampshire, MD  Cholecalciferol (SM VITAMIN D3) 4000 units CAPS Take by mouth daily.    Historical Provider, MD  ezetimibe (ZETIA) 10 MG tablet Take 1 tablet (10 mg total) by mouth daily. 08/16/15   Wellington Hampshire, MD  LevOCARNitine (CARNITINE PO) Take 500 mg by mouth daily. Reported on 08/08/2015    Historical Provider, MD  levothyroxine (SYNTHROID, LEVOTHROID) 100 MCG tablet Take 100 mcg by mouth daily.    Historical Provider, MD  lisinopril (PRINIVIL,ZESTRIL) 40 MG tablet TAKE ONE TABLET BY MOUTH ONCE DAILY 12/03/15   Wellington Hampshire, MD  MAGNESIUM CITRATE PO Take 500 mg by mouth daily.    Historical Provider, MD  nitroGLYCERIN (NITROSTAT) 0.4 MG SL tablet Place 0.4 mg under the tongue every 5 (five) minutes as needed  for chest pain.    Historical Provider, MD  NON FORMULARY Magnesium Taurate takes 1 tablet daily.    Historical Provider, MD  Omega-3 Fatty Acids (OMEGA-3 PLUS) 1000 MG CAPS Take by mouth 2 (two) times daily.    Historical Provider, MD    Allergies Azithromycin; Codeine; Statins; Sulfa antibiotics; and Tape  Family History  Problem Relation Age of Onset  . Heart attack Father 52  . Heart attack Brother 12    Social History Social History  Substance Use Topics  . Smoking status: Never Smoker  . Smokeless tobacco:  Never Used  . Alcohol use No    Review of Systems Constitutional: No fever/chills Eyes: No visual changes. ENT: No sore throat. Cardiovascular: Denies chest pain.Positive for "heart racing" Respiratory: Denies shortness of breath. Gastrointestinal: No abdominal pain.  No nausea, no vomiting.  No diarrhea.  No constipation. Genitourinary: Negative for dysuria. Musculoskeletal: Negative for back pain. Skin: Negative for rash.Positive for facial flushing Neurological: Negative for headaches, focal weakness or numbness.  10-point ROS otherwise negative.  ____________________________________________   PHYSICAL EXAM:  VITAL SIGNS: ED Triage Vitals  Enc Vitals Group     BP 02/19/16 2249 (!) 157/100     Pulse Rate 02/19/16 2249 82     Resp 02/19/16 2249 18     Temp 02/19/16 2249 98.2 F (36.8 C)     Temp Source 02/19/16 2249 Oral     SpO2 02/19/16 2249 95 %     Weight 02/19/16 2250 137 lb (62.1 kg)     Height 02/19/16 2250 5\' 2"  (1.575 m)     Head Circumference --      Peak Flow --      Pain Score --      Pain Loc --      Pain Edu? --      Excl. in [place]? --     Constitutional: Alert and oriented. Well appearing and in no acute distress. Eyes: Conjunctivae are normal. PERRL. EOMI. Head: Atraumatic. Mouth/Throat: Mucous membranes are moist.  Oropharynx non-erythematous. Neck: No stridor.  No meningeal signs. Cardiovascular: Normal rate, regular rhythm. Good peripheral circulation. Grossly normal heart sounds. Respiratory: Normal respiratory effort.  No retractions. Lungs CTAB. Gastrointestinal: Soft and nontender. No distention.  Musculoskeletal: No lower extremity tenderness nor edema. No gross deformities of extremities. Neurologic:  Normal speech and language. No gross focal neurologic deficits are appreciated.  Skin:  Skin is warm, dry and intact. No rash noted. Psychiatric: Mood and affect are normal. Speech and behavior are  normal.  ____________________________________________   LABS (all labs ordered are listed, but only abnormal results are displayed)  Labs Reviewed  CBC - Abnormal; Notable for the following:       Result Value   WBC 11.4 (*)    All other components within normal limits  BASIC METABOLIC PANEL - Abnormal; Notable for the following:    Sodium 132 (*)    Chloride 99 (*)    Glucose, Bld 155 (*)    All other components within normal limits  URINALYSIS COMPLETEWITH MICROSCOPIC ([facility] ONLY) - Abnormal; Notable for the following:    Color, Urine YELLOW (*)    APPearance CLEAR (*)    Bacteria, UA RARE (*)    Squamous Epithelial / LPF 0-5 (*)    All other components within normal limits  TROPONIN I   ____________________________________________  EKG  ED ECG REPORT I, Hermosa N BROWN, the attending physician, personally viewed and interpreted this ECG.  Date: 02/21/2016  EKG Time: 10:59 PM  Rate: 79  Rhythm: Normal sinus rhythm  Axis: Left axis deviation  Intervals: Normal  ST&T Change: None  _____________________ Procedures    INITIAL IMPRESSION / ASSESSMENT AND PLAN / ED COURSE  Pertinent labs & imaging results that were available during my care of the patient were reviewed by me and considered in my medical decision making (see chart for details).  History physical exam consistent with possible medication side effect. Patient referred to Dr. Velva Harman for further outpatient evaluation and management   Clinical Course     ____________________________________________  FINAL CLINICAL IMPRESSION(S) / ED DIAGNOSES  Final diagnoses:  Medication side effect, initial encounter     MEDICATIONS GIVEN DURING THIS VISIT:  Medications - No data to display   NEW OUTPATIENT MEDICATIONS STARTED DURING THIS VISIT:  Discharge Medication List as of 02/20/2016  2:29 AM      Discharge Medication List as of 02/20/2016  2:29 AM      Discharge Medication List as of  02/20/2016  2:29 AM       Note:  This document was prepared using Dragon voice recognition software and may include unintentional dictation errors.    Gregor Hams, MD 02/21/16 [phone]

## 2016-02-21 ENCOUNTER — Encounter: Payer: Self-pay | Admitting: Cardiovascular Disease

## 2016-02-25 ENCOUNTER — Other Ambulatory Visit: Payer: Self-pay

## 2016-02-25 MED ORDER — CARVEDILOL 12.5 MG PO TABS: 12.5000 mg | ORAL_TABLET | Freq: Two times a day (BID) | ORAL | 3 refills | Status: DC

## 2016-04-27 ENCOUNTER — Other Ambulatory Visit: Payer: Self-pay | Admitting: Cardiovascular Disease

## 2016-06-02 ENCOUNTER — Other Ambulatory Visit: Payer: Self-pay | Admitting: Cardiovascular Disease

## 2016-06-26 DIAGNOSIS — Z789 Other specified health status: Secondary | ICD-10-CM | POA: Insufficient documentation

## 2016-08-06 ENCOUNTER — Ambulatory Visit (INDEPENDENT_AMBULATORY_CARE_PROVIDER_SITE_OTHER): Payer: Medicare Other | Admitting: Cardiovascular Disease

## 2016-08-06 ENCOUNTER — Encounter: Payer: Self-pay | Admitting: Cardiovascular Disease

## 2016-08-06 VITALS — BP 140/78 | HR 62 | Ht 60.0 in | Wt 142.8 lb

## 2016-08-06 DIAGNOSIS — I5022 Chronic systolic (congestive) heart failure: Secondary | ICD-10-CM

## 2016-08-06 DIAGNOSIS — I2510 Atherosclerotic heart disease of native coronary artery without angina pectoris: Secondary | ICD-10-CM

## 2016-08-06 DIAGNOSIS — E7800 Pure hypercholesterolemia, unspecified: Secondary | ICD-10-CM | POA: Diagnosis not present

## 2016-08-06 DIAGNOSIS — I10 Essential (primary) hypertension: Secondary | ICD-10-CM | POA: Diagnosis not present

## 2016-08-06 MED ORDER — CARVEDILOL 12.5 MG PO TABS: ORAL_TABLET | ORAL | 1 refills | Status: DC

## 2016-08-06 NOTE — Progress Notes (Signed)
Cardiology Office Note   Date:  08/06/2016   ID:  Rhylen, Pulido Dec 11, 1931, MRN [id number]  PCP:  Gearldine Shown, DO  Cardiologist:   Kathlyn Sacramento, MD   Chief Complaint  Patient presents with  . other    6 month follow up. Meds reviewed by the pt. verbally. "doing well." Pt. c/o LE edema at night.       History of Present Illness: Kendra Prince is a 83 y.o. female who presents for  a follow-up visit regarding coronary artery disease. She has known history of hypertension and type 2 diabetes. She has previous history of palpitations controlled with atenolol. She presented in December 2015 with non-ST elevation myocardial infarction with anterior ischemic changes on EKG. She underwent cardiac catheterization which showed 99% stenosis in the mid LAD with ejection fraction of 40%. PCI and drug-eluting stent placement was performed to the LAD without complications.  She has known history of hyperlipidemia with known intolerance to all statins.  She was prescribed amlodipine during last visit for uncontrolled hypertension. However, she did not tolerate the medication due to flushing and palpitations. She changes the dose of carvedilol to 6.25 mg in the morning and 12.5 mg at night with overall improvement. She continues to take lisinopril 40 mg once daily. She feels well overall with no chest pain or shortness of breath.   Past Medical History:  Diagnosis Date  . Chronic systolic heart failure (HCC)    Due to ischemic cardiomyopathy with ejection fraction of 40%  . Coronary artery disease    Non-ST elevation myocardial infarction in December 2015. Cardiac catheterization showed 99% mid LAD stenosis, ejection fraction of 40% with moderate anterolateral and apical hypokinesis. She underwent PCI and drug-eluting stent placement to the mid LAD (2.5 x 33 mm Xience drug-eluting stent) . There was also 50% distal left circumflex stenosis.  . Diabetes mellitus without  complication ([place])   . Hyperlipidemia   . Hypertension   . Hypothyroidism   . MI (myocardial infarction) [facility])     Past Surgical History:  Procedure Laterality Date  . CARDIAC CATHETERIZATION  03/16/2014   x1 stent  . CARPAL TUNNEL RELEASE    . MOHS SURGERY    . SHOULDER SURGERY    . TONSILLECTOMY    . TOTAL ABDOMINAL HYSTERECTOMY    . TOTAL KNEE ARTHROPLASTY Left      Current Outpatient Prescriptions  Medication Sig Dispense Refill  . albuterol (PROVENTIL HFA;VENTOLIN HFA) 108 (90 BASE) MCG/ACT inhaler Inhale 2 puffs into the lungs every 6 (six) hours as needed for wheezing or shortness of breath. 1 Inhaler 0  . ARGININE PO Take by mouth daily.    Marland Kitchen aspirin EC 81 MG tablet Take 1 tablet (81 mg total) by mouth daily. 90 tablet 3  . carvedilol (COREG) 12.5 MG tablet Take 0.5 tablets (6.25 mg total) by mouth 2 (two) times daily. 180 tablet 3  . Cholecalciferol (SM VITAMIN D3) 4000 units CAPS Take by mouth daily.    Marland Kitchen ezetimibe (ZETIA) 10 MG tablet Take 1 tablet (10 mg total) by mouth daily. 30 tablet 6  . LevOCARNitine (CARNITINE PO) Take 500 mg by mouth daily. Reported on 08/08/2015    . levothyroxine (SYNTHROID, LEVOTHROID) 100 MCG tablet Take 100 mcg by mouth daily.    Marland Kitchen lisinopril (PRINIVIL,ZESTRIL) 40 MG tablet TAKE ONE TABLET BY MOUTH ONCE DAILY 30 tablet 3  . MAGNESIUM CITRATE PO Take 500 mg by mouth daily.    Marland Kitchen  nitroGLYCERIN (NITROSTAT) 0.4 MG SL tablet Place 0.4 mg under the tongue every 5 (five) minutes as needed for chest pain.    . NON FORMULARY Magnesium Taurate takes 1 tablet daily.    . Omega-3 Fatty Acids (OMEGA-3 PLUS) 1000 MG CAPS Take by mouth 2 (two) times daily.     No current facility-administered medications for this visit.     Allergies:   Azithromycin; Codeine; Norvasc [amlodipine besylate]; Statins; Sulfa antibiotics; and Tape    Social History:  The patient  reports that she has never smoked. She has never used smokeless tobacco. She reports that she  does not drink alcohol or use drugs.   Family History:  The patient's family history includes Heart attack (age of onset: 58) in her brother; Heart attack (age of onset: 25) in her father.    ROS:  Please see the history of present illness.   Otherwise, review of systems are positive for none.   All other systems are reviewed and negative.    PHYSICAL EXAM: VS:  BP 140/78 (BP Location: Left Arm, Patient Position: Sitting, Cuff Size: Normal)   Pulse 62   Ht 5' (1.524 m)   Wt 142 lb 12 oz (64.8 kg)   BMI 27.88 kg/m  , BMI Body mass index is 27.88 kg/m. GEN: Well nourished, well developed, in no acute distress  HEENT: normal  Neck: no JVD, carotid bruits, or masses Cardiac: RRR; no murmurs, rubs, or gallops,no edema  Respiratory:  clear to auscultation bilaterally, normal work of breathing GI: soft, nontender, nondistended, + BS MS: no deformity or atrophy  Skin: warm and dry, no rash Neuro:  Strength and sensation are intact Psych: euthymic mood, full affect   EKG:  EKG is ordered today. The ekg ordered today demonstrates normal sinus rhythm with no significant ST or T wave changes.   Recent Labs: 09/20/2015: ALT 12 02/19/2016: BUN 14; Creatinine, Ser 0.60; Hemoglobin 14.2; Platelets 264; Potassium 4.4; Sodium 132    Lipid Panel    Component Value Date/Time   CHOL 200 (H) 09/20/2015 0928   CHOL 145 03/16/2014 0538   TRIG 156 (H) 09/20/2015 0928   TRIG 173 03/16/2014 0538   HDL 44 09/20/2015 0928   HDL 40 03/16/2014 0538   CHOLHDL 4.5 (H) 09/20/2015 0928   VLDL 35 03/16/2014 0538   LDLCALC 125 (H) 09/20/2015 0928   LDLCALC 70 03/16/2014 0538      Wt Readings from Last 3 Encounters:  08/06/16 142 lb 12 oz (64.8 kg)  02/19/16 137 lb (62.1 kg)  02/11/16 137 lb (62.1 kg)        ASSESSMENT AND PLAN:  1.  Coronary artery disease involving native coronary arteries without angina:  She is doing well overall with no anginal symptoms. Continue aspirin indefinitely.    2. Hyperlipidemia:  Unfortunately, she is intolerant to all statins. She could not afford Zetia.  3. Essential hypertension:  Blood pressure is reasonably controlled. We can consider spironolactone in the future.   4. Chronic systolic heart failure: Ejection fraction was 40% of the time of myocardial infarction. She is euvolemic without any diuretics. Continue treatment with carvedilol and lisinopril.   Disposition:   FU with me in 6 months  Signed,  Kathlyn Sacramento, MD  08/06/2016 10:01 AM    [place]

## 2016-08-06 NOTE — Patient Instructions (Signed)

## 2016-10-23 ENCOUNTER — Other Ambulatory Visit: Payer: Self-pay

## 2016-10-23 MED ORDER — LISINOPRIL 40 MG PO TABS: 40.0000 mg | ORAL_TABLET | Freq: Every day | ORAL | 3 refills | Status: DC

## 2016-10-23 NOTE — Telephone Encounter (Signed)
Requested Prescriptions   Signed Prescriptions Disp Refills  . lisinopril (PRINIVIL,ZESTRIL) 40 MG tablet 30 tablet 3    Sig: Take 1 tablet (40 mg total) by mouth daily.    Authorizing Provider: Kathlyn Sacramento A    Ordering User: Janan Ridge

## 2017-02-03 ENCOUNTER — Other Ambulatory Visit: Payer: Self-pay | Admitting: *Deleted

## 2017-02-03 MED ORDER — LISINOPRIL 40 MG PO TABS: 40.0000 mg | ORAL_TABLET | Freq: Every day | ORAL | 0 refills | Status: DC

## 2017-03-01 ENCOUNTER — Ambulatory Visit: Payer: Medicare Other | Admitting: Cardiovascular Disease

## 2017-03-15 ENCOUNTER — Other Ambulatory Visit: Payer: Self-pay | Admitting: Cardiovascular Disease

## 2017-03-31 ENCOUNTER — Telehealth: Payer: Self-pay | Admitting: Nurse Practitioner

## 2017-03-31 ENCOUNTER — Ambulatory Visit (INDEPENDENT_AMBULATORY_CARE_PROVIDER_SITE_OTHER): Payer: Medicare Other | Admitting: Nurse Practitioner

## 2017-03-31 ENCOUNTER — Encounter: Payer: Self-pay | Admitting: Nurse Practitioner

## 2017-03-31 VITALS — BP 140/68 | HR 59 | Ht 61.0 in | Wt 140.5 lb

## 2017-03-31 DIAGNOSIS — I255 Ischemic cardiomyopathy: Secondary | ICD-10-CM

## 2017-03-31 DIAGNOSIS — E7800 Pure hypercholesterolemia, unspecified: Secondary | ICD-10-CM

## 2017-03-31 DIAGNOSIS — I10 Essential (primary) hypertension: Secondary | ICD-10-CM

## 2017-03-31 DIAGNOSIS — I209 Angina pectoris, unspecified: Secondary | ICD-10-CM

## 2017-03-31 DIAGNOSIS — I25119 Atherosclerotic heart disease of native coronary artery with unspecified angina pectoris: Secondary | ICD-10-CM

## 2017-03-31 DIAGNOSIS — R002 Palpitations: Secondary | ICD-10-CM

## 2017-03-31 NOTE — Progress Notes (Signed)
Office Visit    Patient Name: Kendra Prince Date of Encounter: 03/31/2017  Primary Care Provider:  Gearldine Shown, DO Primary Cardiologist:  Kathlyn Sacramento, MD  Chief Complaint    84 y/o ? with a history of CAD status post non-STEMI in December 2015 requiring drug-eluting stent placement to the LAD, hypertension, hyperlipidemia (statin intolerant), ischemic cardiomyopathy with an EF of 40%, diabetes, and hypothyroidism, who presents for follow-up.  Past Medical History    Past Medical History:  Diagnosis Date  . Chronic systolic heart failure ([place])    a. 02/2014 LV gram: EF 40%, mod antlat HK, sev apical HK.  . Coronary artery disease    a. 02/2014 NSTEMI/PCI: LM nl, LAD 28m (2.5x33 Xience [place] DES), LCX 50d, OM1/2 nl, RCA nl, RPDA nl, RPL1 nl, EF 40%.  . Diabetes mellitus without complication ([place])   . Hyperlipidemia    a. Statin intolerant; b. Could not afford Zetia.  . Hypertension   . Hypothyroidism   . Ischemic cardiomyopathy    a. 02/2014 LV gram: EF 40%, mod antlat HK, sev apical HK.  . MI (myocardial infarction) ([place]) 02/2014   Past Surgical History:  Procedure Laterality Date  . CARDIAC CATHETERIZATION  03/16/2014   x1 stent  . CARPAL TUNNEL RELEASE    . MOHS SURGERY    . SHOULDER SURGERY    . TONSILLECTOMY    . TOTAL ABDOMINAL HYSTERECTOMY    . TOTAL KNEE ARTHROPLASTY Left     Allergies  Allergies  Allergen Reactions  . Amlodipine Other (See Comments) and Palpitations  . Azithromycin Other (See Comments)    Other reaction(s): Vomiting  . Norvasc [Amlodipine Besylate]     Shakes    . Statins Other (See Comments)    Myalgia Crestor and Atorvastatin  . Sulfa Antibiotics Itching  . Tape   . Codeine Other (See Comments) and Rash    hallucinations    History of Present Illness    84 y/o ? with the above complex past medical history including coronary artery disease status post non-STEMI in December 2015.  Catheterization at that  time revealed a 99% lesion in the mid LAD which required placement of a drug-eluting stent.  EF was 40% with moderate anterolateral hypokinesis and severe apical hypokinesis.  In that setting, other history includes ischemic cardiomyopathy, hypertension, diabetes, hypothyroidism, and hyperlipidemia.  Unfortunately, she is statin intolerant.  She was last seen in clinic in May 2018 and since that time, has done reasonably well.  She has arthritis in multiple joints and therefore has some degree of chronic pain but does get around pretty well.  She had been doing some light exercise on a regular basis but sort of fell off over the past few months.  She and her husband recently moved the gym equipment into their house and out of the garage and she does plan to exercise more in the new year.  About 2-3 weeks ago, she awoke one evening feeling as though her heart was racing and this was associated with right arm discomfort.  She was not dyspneic.  This persisted for about an hour and then tachycardia and arm pain resolved.  She does have a history of intermittent tachypalpitations, which have occurred infrequently over the past 4-5 years - generally less than once a year.  Right arm pain was somewhat reminiscent of prior anginal equivalent though at the time of her MI.  She also had substernal chest pain radiating through to her  back, associated bilateral shoulder and arm pain.  For about a week after that episode, she noted generalized malaise and low energy.  She did not had any recurrence of right arm pain and denies experiencing any chest pain or dyspnea.  She just did not have much energy.  This has since resolved and she is now feeling much better.  She denies PND, orthopnea, dizziness, syncope, edema, or early satiety.  Home Medications    Prior to Admission medications   Medication Sig Start Date End Date Taking? Authorizing Provider  albuterol (PROVENTIL HFA;VENTOLIN HFA) 108 (90 BASE) MCG/ACT inhaler  Inhale 2 puffs into the lungs every 6 (six) hours as needed for wheezing or shortness of breath. 05/07/14  Yes Wellington Hampshire, MD  ARGININE PO Take by mouth daily.   Yes [provider]  aspirin EC 81 MG tablet Take 1 tablet (81 mg total) by mouth daily. 02/11/16  Yes Wellington Hampshire, MD  carvedilol (COREG) 12.5 MG tablet TAKE 6.25 MG ( 0.5 TAB ) BY MOUTH IN THE MORNING AND TAKE 12.5 MG ( 1 TAB ) IN THE EVENING 03/16/17  Yes Wellington Hampshire, MD  COD LIVER OIL/VITAMINS A & D CAPS Take by mouth daily.   Yes [provider]  LevOCARNitine (CARNITINE PO) Take 500 mg by mouth daily. Reported on 08/08/2015   Yes [provider]  levothyroxine (SYNTHROID, LEVOTHROID) 100 MCG tablet Take 100 mcg by mouth daily.   Yes [provider]  lisinopril (PRINIVIL,ZESTRIL) 40 MG tablet Take 1 tablet (40 mg total) daily by mouth. 02/03/17  Yes Wellington Hampshire, MD  MAGNESIUM CITRATE PO Take 500 mg by mouth daily.   Yes [provider]  nitroGLYCERIN (NITROSTAT) 0.4 MG SL tablet Place 0.4 mg under the tongue every 5 (five) minutes as needed for chest pain.   Yes [provider]    Review of Systems    Episode of palpitations and right arm pain a few weeks ago.  This was followed by about a week's worth of general malaise.  This has since resolved.  She denies any chest pain, dyspnea, PND, orthopnea, dizziness, syncope, edema, or early satiety.  All other systems reviewed and are otherwise negative except as noted above.  Physical Exam    VS:  BP 140/68 (BP Location: Left Arm, Patient Position: Sitting, Cuff Size: Normal)   Pulse (!) 59   Ht 5\' 1"  (1.549 m)   Wt 140 lb 8 oz (63.7 kg)   BMI 26.55 kg/m  , BMI Body mass index is 26.55 kg/m. GEN: Well nourished, well developed, in no acute distress.  HEENT: normal.  Neck: Supple, no JVD, carotid bruits, or masses. Cardiac: RRR, no murmurs, rubs, or gallops. No clubbing, cyanosis, edema.  Radials/DP/PT 2+  and equal bilaterally.  Respiratory:  Respirations regular and unlabored, clear to auscultation bilaterally. GI: Soft, nontender, nondistended, BS + x 4. MS: no deformity or atrophy. Skin: warm and dry, no rash. Neuro:  Strength and sensation are intact. Psych: Normal affect.  Accessory Clinical Findings    ECG -sinus bradycardia, 59, septal T wave inversion which is new since last ECG.  Assessment & Plan    1.  Coronary artery disease: Status post prior non-STEMI with drug-eluting stent placement to the LAD in December 2015.  A few weeks ago, she awoke with tachypalpitations and right arm discomfort.  This was somewhat reminiscent of prior anginal equivalent though not nearly as severe.  She was not having  any chest pain or dyspnea.  Symptoms persisted for about an hour and then resolve spontaneously.  For about a week after that episode, she noted general malaise and low energy state.  Again, she was not experience any chest pain or dyspnea during that period of time either.  EKG shows new T wave inversion in leads V1 and V2.  In the setting of prior LAD stent and this recent episode, I will arrange for a Lexiscan Myoview to rule out ischemia.  She remains on aspirin, beta-blocker, and ACE inhibitor therapy.  She is intolerant of statins and could not previously afford Zetia.  She is not interested in PCSK9i therapy 2/2 perceived cost.  2.  Essential hypertension: Blood pressure is 140/68 this morning.  She says that is pretty typical for morning blood pressure but later in the day she often gets readings as low as 100/60.  In that setting, I will not make any changes to her medicines today.  She remains on carvedilol 6.2 5 in the AM and 12 point 5 in the PM.  She is also on lisinopril 40 mg daily.  3.  Hyperlipidemia: Unfortunately, she is statin intolerant and could not afford Zetia previously.  Last LDL was 125 in June 2017.  She does not think that PCSK9i Rx would be financially  feasible.  4.  Ischemic cardiomyopathy: EF 40% by left ventriculography in December 2015.  She is euvolemic on exam and has not been having any symptoms of dyspnea.  She remains on beta-blocker and ACE inhibitor therapy.  5.  Diet-controlled diabetes mellitus: Hemoglobin A1c 6.6 in October.  Followed by primary care.  6.  Palpitations: Patient reports a history of rapid heartbeat.  This has occurred only a few times in the past 5 years.  She did have an episode a few weeks ago that lasted about an hour.  She has not had any recurrence.  She remains on beta-blocker therapy.  We did discuss possibly placing an event monitor however given scarcity of the occurrence, at this time, we will hold off.  If she has any recurrence, we would have a low threshold to place a 30-day event monitor.    7.  Disposition: Follow-up Lexiscan Myoview.  Follow-up in clinic afterwards.  Murray Hodgkins, NP 03/31/2017, 9:48 AM

## 2017-03-31 NOTE — Patient Instructions (Addendum)
Medication Instructions: - Your physician recommends that you continue on your current medications as directed. Please refer to the Current Medication list given to you today.  Labwork: - none ordered  Procedures/Testing: - Your physician has requested that you have a lexiscan myoview. For further information please visit [email or web address]. Please follow instruction sheet, as given.  [place]  Your caregiver has ordered a Stress Test with nuclear imaging. The purpose of this test is to evaluate the blood supply to your heart muscle. This procedure is referred to as a "Non-Invasive Stress Test." This is because other than having an IV started in your vein, nothing is inserted or "invades" your body. Cardiac stress tests are done to find areas of poor blood flow to the heart by determining the extent of coronary artery disease (CAD). Some patients exercise on a treadmill, which naturally increases the blood flow to your heart, while others who are  unable to walk on a treadmill due to physical limitations have a pharmacologic/chemical stress agent called Lexiscan . This medicine will mimic walking on a treadmill by temporarily increasing your coronary blood flow.   Please note: these test may take anywhere between 2-4 hours to complete  PLEASE REPORT TO [facility] AT THE FIRST DESK WILL DIRECT YOU WHERE TO GO  Date of Procedure:_____________________________________  Arrival Time for Procedure:______________________________  Instructions regarding medication:   _x___:  Hold betablocker(s) night before procedure and morning of procedure (Carvedilol)    PLEASE NOTIFY THE OFFICE AT LEAST 24 HOURS IN ADVANCE IF YOU ARE UNABLE TO KEEP YOUR APPOINTMENT.  [phone] AND  PLEASE NOTIFY NUCLEAR MEDICINE AT [facility] AT LEAST 24 HOURS IN ADVANCE IF YOU ARE UNABLE TO KEEP YOUR APPOINTMENT. [phone]  How to prepare for your Myoview test:  1. Do not eat or  drink after midnight 2. No caffeine for 24 hours prior to test 3. No smoking 24 hours prior to test. 4. Your medication may be taken with water.  If your doctor stopped a medication because of this test, do not take that medication. 5. Ladies, please do not wear dresses.  Skirts or pants are appropriate. Please wear a short sleeve shirt. 6. No perfume, cologne or lotion. 7. Wear comfortable walking shoes. No heels!    ** Please call the office back at [phone] when you have some possible dates that may work for you**    Follow-Up: - Your physician recommends that you schedule a follow-up appointment in: 1 month with Dr. Fletcher Anon Ignacia Bayley, NP   Any Additional Special Instructions Will Be Listed Below (If Applicable).     If you need a refill on your cardiac medications before your next appointment, please call your pharmacy.

## 2017-03-31 NOTE — Telephone Encounter (Signed)
Spoke with patient and scheduled her [place] for 04/12/2017 at 08:00 AM with arrival time of 07:45AM at the [facility] Entrance. She verbalized understanding of all instructions and had no further questions at this time.

## 2017-03-31 NOTE — Telephone Encounter (Signed)
Pt is ready to schedule her myoview. Pt is requesting on a Monday, 1/14, 1/21, /1/28. Please call.

## 2017-04-08 ENCOUNTER — Telehealth: Payer: Self-pay | Admitting: Cardiovascular Disease

## 2017-04-08 NOTE — Telephone Encounter (Signed)
S/w patient. She is concerned about the potential for winter weather on Monday. She would like to reschedule Lexiscan. Patient agreeable to come on 04/14/17, arrival time of 7:45 am. She verbalized understanding of appointment date and time change and to continue to follow instructions for test as previously given.

## 2017-04-08 NOTE — Telephone Encounter (Signed)
Patient wants to cancel and r/s stress test 1/14 due to pending weather event .  Please call

## 2017-04-14 ENCOUNTER — Encounter
Admission: RE | Admit: 2017-04-14 | Discharge: 2017-04-14 | Disposition: A | Discharge status: Home / Self Care | Payer: Medicare Other | Source: Ambulatory Visit | Attending: Nurse Practitioner | Admitting: Nurse Practitioner

## 2017-04-14 DIAGNOSIS — I25119 Atherosclerotic heart disease of native coronary artery with unspecified angina pectoris: Secondary | ICD-10-CM | POA: Insufficient documentation

## 2017-04-14 LAB — NM MYOCAR MULTI W/SPECT W/WALL MOTION / EF
CHL CUP RESTING HR STRESS: 62 {beats}/min
CSEPHR: 72 %
LV dias vol: 64 mL (ref 46–106)
LV sys vol: 23 mL
NUC STRESS TID: 1.03
Peak HR: 100 {beats}/min
SDS: 1
SRS: 9
SSS: 3

## 2017-04-14 MED ORDER — TECHNETIUM TC 99M TETROFOSMIN IV KIT
12.2400 | PACK | Freq: Once | INTRAVENOUS | Status: AC | PRN
  Administered 2017-04-14: 12.24 via INTRAVENOUS

## 2017-04-14 MED ORDER — TECHNETIUM TC 99M TETROFOSMIN IV KIT
30.7400 | PACK | Freq: Once | INTRAVENOUS | Status: AC | PRN
  Administered 2017-04-14: 30.74 via INTRAVENOUS

## 2017-04-14 MED ORDER — REGADENOSON 0.4 MG/5ML IV SOLN
0.4000 mg | Freq: Once | INTRAVENOUS | Status: AC
  Administered 2017-04-14: 0.4 mg via INTRAVENOUS

## 2017-05-03 ENCOUNTER — Ambulatory Visit: Payer: Medicare Other | Admitting: Physician Assistant

## 2017-05-19 NOTE — Progress Notes (Signed)
Cardiology Office Note Date:  05/20/2017  Patient ID:  Kendra, Prince 1932/01/22, MRN [id number] PCP:  Gearldine Shown, DO  Cardiologist:  Dr. Fletcher Anon, MD    Chief Complaint: Follow-up  History of Present Illness: Kendra Prince is a 84 y.o. female with history of CAD status post non-STEMI in 02/2014 requiring drug-eluting stent placement to the LAD, hypertension, hyperlipidemia (statin intolerant), ischemic cardiomyopathy with an EF of 40%, diabetes, arthritis, and hypothyroidism who presents for follow-up of chest pain.   She was admitted to the hospital in 02/2014 for a non-STEMI with cardiac catheterization at that time showing 99% stenosis in the mid LAD with successful PCI/DES.  EF was 40% with moderate anterolateral hypokinesis and severe apical hypokinesis.  She was most recently seen in the office on 03/31/17 and noted an episode of palpitations with associated right arm discomfort one evening.  Symptoms persisted for about an hour followed by spontaneous resolution.  Her right arm pain was somewhat similar to her prior anginal equivalent at the time of her MI.  It was noted that she does have a history of intermittent tachypalpitations.  For about a week after that episode she continued to note generalized malaise and fatigue.  There was never any chest pain or dyspnea.  EKG at the visit on 1/2 showed new T wave inversions in leads V1 and V2.  She underwent Lexiscan Myoview on 04/14/17 that showed no significant ischemia.  Small region of mild fixed apical and apical septal perfusion defect consistent with previous MI versus attenuation artifact was noted.  EF 57%.  No EKG changes concerning for ischemia at peak stress or recovery.  Resting EKG with IVCD.  Low risk scan.  She comes in doing well from a cardiac perspective today.  No further chest pain/right arm pain.  No further tachypalpitations.  She continues to note systolic blood pressures in the 140s in the morning and  low 100s in the afternoon.  She continues to take carvedilol 6.2 5 in the morning and 12 point 5 in the evening.  She remains on lisinopril 40 mg daily.  Tolerating aspirin without issues.  Has never opened her sublingual nitroglycerin dating back to 2015.  She feels like her episode of tachypalpitations and right arm pain were in the setting of a brief episode of hyperglycemia that evening.  She is now watching what she is eating and feeling much better.  She does not have any concerns at this time.   Past Medical History:  Diagnosis Date  . Chronic systolic heart failure ([place])    a. 02/2014 LV gram: EF 40%, mod antlat HK, sev apical HK.  . Coronary artery disease    a. 02/2014 NSTEMI/PCI: LM nl, LAD 72m (2.5x33 Xience Alpine DES), LCX 50d, OM1/2 nl, RCA nl, RPDA nl, RPL1 nl, EF 40%.  . Diabetes mellitus without complication ([place])   . Hyperlipidemia    a. Statin intolerant; b. Could not afford Zetia.  . Hypertension   . Hypothyroidism   . Ischemic cardiomyopathy    a. 02/2014 LV gram: EF 40%, mod antlat HK, sev apical HK.  . MI (myocardial infarction) ([place]) 02/2014    Past Surgical History:  Procedure Laterality Date  . CARDIAC CATHETERIZATION  03/16/2014   x1 stent  . CARPAL TUNNEL RELEASE    . MOHS SURGERY    . SHOULDER SURGERY    . TONSILLECTOMY    . TOTAL ABDOMINAL HYSTERECTOMY    . TOTAL KNEE ARTHROPLASTY Left  Current Meds  Medication Sig  . albuterol (PROVENTIL HFA;VENTOLIN HFA) 108 (90 BASE) MCG/ACT inhaler Inhale 2 puffs into the lungs every 6 (six) hours as needed for wheezing or shortness of breath.  . ARGININE PO Take by mouth daily.  Marland Kitchen aspirin EC 81 MG tablet Take 1 tablet (81 mg total) by mouth daily.  . carvedilol (COREG) 12.5 MG tablet TAKE 6.25 MG ( 0.5 TAB ) BY MOUTH IN THE MORNING AND TAKE 12.5 MG ( 1 TAB ) IN THE EVENING  . COD LIVER OIL/VITAMINS A & D CAPS Take by mouth daily.  . LevOCARNitine (CARNITINE PO) Take 500 mg by mouth daily. Reported on  08/08/2015  . levothyroxine (SYNTHROID, LEVOTHROID) 100 MCG tablet Take 100 mcg by mouth daily.  Marland Kitchen lisinopril (PRINIVIL,ZESTRIL) 40 MG tablet Take 1 tablet (40 mg total) daily by mouth.  Marland Kitchen MAGNESIUM CITRATE PO Take 500 mg by mouth daily.  . nitroGLYCERIN (NITROSTAT) 0.4 MG SL tablet Place 0.4 mg under the tongue every 5 (five) minutes as needed for chest pain.    Allergies:   Amlodipine; Azithromycin; Norvasc [amlodipine besylate]; Statins; Sulfa antibiotics; Tape; and Codeine   Social History:  The patient  reports that  has never smoked. she has never used smokeless tobacco. She reports that she does not drink alcohol or use drugs.   Family History:  The patient's family history includes Heart attack (age of onset: 72) in her brother; Heart attack (age of onset: 2) in her father.  ROS:   Review of Systems  Constitutional: Negative for chills, diaphoresis, fever, malaise/fatigue and weight loss.  HENT: Negative for congestion.   Eyes: Negative for discharge and redness.  Respiratory: Negative for cough, hemoptysis, sputum production, shortness of breath and wheezing.   Cardiovascular: Negative for chest pain, palpitations, orthopnea, claudication, leg swelling and PND.  Gastrointestinal: Negative for abdominal pain, blood in stool, heartburn, melena, nausea and vomiting.  Genitourinary: Negative for hematuria.  Musculoskeletal: Positive for joint pain and myalgias. Negative for falls.  Skin: Negative for rash.  Neurological: Negative for dizziness, tingling, tremors, sensory change, speech change, focal weakness, loss of consciousness and weakness.  Endo/Heme/Allergies: Does not bruise/bleed easily.  Psychiatric/Behavioral: Negative for substance abuse. The patient is not nervous/anxious.   All other systems reviewed and are negative.    PHYSICAL EXAM:  VS:  BP (!) 146/70 (BP Location: Left Arm, Patient Position: Sitting, Cuff Size: Normal)   Pulse 63   Ht 5\' 1"  (1.549 m)   Wt 140  lb (63.5 kg)   SpO2 98%   BMI 26.45 kg/m  BMI: Body mass index is 26.45 kg/m.  Physical Exam  Constitutional: She is oriented to person, place, and time. She appears well-developed and well-nourished.  HENT:  Head: Normocephalic and atraumatic.  Eyes: Right eye exhibits no discharge. Left eye exhibits no discharge.  Neck: Normal range of motion. No JVD present.  Cardiovascular: Normal rate, regular rhythm, S1 normal, S2 normal and normal heart sounds. Exam reveals no distant heart sounds, no friction rub, no midsystolic click and no opening snap.  No murmur heard. Pulses:      Posterior tibial pulses are 2+ on the right side, and 2+ on the left side.  Pulmonary/Chest: Effort normal and breath sounds normal. No respiratory distress. She has no decreased breath sounds. She has no wheezes. She has no rales. She exhibits no tenderness.  Abdominal: Soft. She exhibits no distension. There is no tenderness.  Musculoskeletal: She exhibits no edema.  Neurological: She  is alert and oriented to person, place, and time.  Skin: Skin is warm and dry. No cyanosis. Nails show no clubbing.  Psychiatric: She has a normal mood and affect. Her speech is normal and behavior is normal. Judgment and thought content normal.     EKG:  Was not ordered today.  Recent Labs: No results found for requested labs within last 8760 hours.  No results found for requested labs within last 8760 hours.   CrCl cannot be calculated (Patient's most recent lab result is older than the maximum 21 days allowed.).   Wt Readings from Last 3 Encounters:  05/20/17 140 lb (63.5 kg)  03/31/17 140 lb 8 oz (63.7 kg)  08/06/16 142 lb 12 oz (64.8 kg)     Other studies reviewed: Additional studies/records reviewed today include: summarized above  ASSESSMENT AND PLAN:  1. CAD of native coronary artery without angina: Doing well.  No further symptoms concerning for angina.  Recent low risk nuclear stress test as above.  Continue  aspirin 81 mg daily.  No plans for further ischemic evaluation at this time.  2. Ischemic cardiomyopathy: She does not appear grossly volume overloaded at she has no symptoms concerning for dyspnea.  Continue beta-blocker and ACE inhibitor.  CHF education provided.  3. HLD: Most recent LDL from 06/2016 of 154.  Statin intolerant.  Could not previously afford Zetia.  She remains uninterested and PCSK-9 inhibitors.  4. Essential hypertension: Blood pressure is modestly elevated at 146/70 this morning.  She reports this is typical for her to have a morning blood pressure in the 140s and by the afternoon her blood pressure will be in the low 100s over 70s.  Given this has been her for quite a long time we will not make any medication adjustments at this time.  She will continue carvedilol 6.25 mg in the morning and 12.5 mg in the afternoon.  Continue lisinopril 40 mill grams daily.  5. Palpitations: No further issues.  Should she have recurrence of palpitations would recommend 30-day event monitor at that time.  6. Diabetes mellitus: Per PCP.  Disposition: F/u with Dr. Fletcher Anon in 6 months.  Current medicines are reviewed at length with the patient today.  The patient did not have any concerns regarding medicines.  Signed, Christell Faith, PA-C 05/20/2017 11:00 AM     [address] [place], [place] [address] [phone]

## 2017-05-20 ENCOUNTER — Encounter: Payer: Self-pay | Admitting: Physician Assistant

## 2017-05-20 ENCOUNTER — Ambulatory Visit (INDEPENDENT_AMBULATORY_CARE_PROVIDER_SITE_OTHER): Payer: Medicare Other | Admitting: Physician Assistant

## 2017-05-20 VITALS — BP 146/70 | HR 63 | Ht 61.0 in | Wt 140.0 lb

## 2017-05-20 DIAGNOSIS — I255 Ischemic cardiomyopathy: Secondary | ICD-10-CM

## 2017-05-20 DIAGNOSIS — I5022 Chronic systolic (congestive) heart failure: Secondary | ICD-10-CM | POA: Diagnosis not present

## 2017-05-20 DIAGNOSIS — I2510 Atherosclerotic heart disease of native coronary artery without angina pectoris: Secondary | ICD-10-CM | POA: Diagnosis not present

## 2017-05-20 DIAGNOSIS — R002 Palpitations: Secondary | ICD-10-CM | POA: Diagnosis not present

## 2017-05-20 DIAGNOSIS — E785 Hyperlipidemia, unspecified: Secondary | ICD-10-CM

## 2017-05-20 DIAGNOSIS — I10 Essential (primary) hypertension: Secondary | ICD-10-CM | POA: Diagnosis not present

## 2017-05-20 NOTE — Patient Instructions (Signed)
Medication Instructions:  Your physician recommends that you continue on your current medications as directed. Please refer to the Current Medication list given to you today.   Labwork: none  Testing/Procedures: none  Follow-Up: Your physician wants you to follow-up in: 6 MONTHS WITH DR ARIDA.  You will receive a reminder letter in the mail two months in advance. If you don't receive a letter, please call our office to schedule the follow-up appointment.  If you need a refill on your cardiac medications before your next appointment, please call your pharmacy.   

## 2017-08-05 ENCOUNTER — Other Ambulatory Visit: Payer: Self-pay | Admitting: Internal Medicine

## 2017-08-05 DIAGNOSIS — R1013 Epigastric pain: Secondary | ICD-10-CM

## 2017-08-12 ENCOUNTER — Ambulatory Visit
Admission: RE | Admit: 2017-08-12 | Discharge: 2017-08-12 | Disposition: A | Discharge status: Home / Self Care | Payer: Medicare Other | Source: Ambulatory Visit | Attending: Internal Medicine | Admitting: Internal Medicine

## 2017-08-12 DIAGNOSIS — R1013 Epigastric pain: Secondary | ICD-10-CM | POA: Insufficient documentation

## 2017-08-12 DIAGNOSIS — K219 Gastro-esophageal reflux disease without esophagitis: Secondary | ICD-10-CM | POA: Insufficient documentation

## 2017-08-12 DIAGNOSIS — K222 Esophageal obstruction: Secondary | ICD-10-CM | POA: Insufficient documentation

## 2017-08-12 DIAGNOSIS — K449 Diaphragmatic hernia without obstruction or gangrene: Secondary | ICD-10-CM | POA: Insufficient documentation

## 2017-09-05 ENCOUNTER — Other Ambulatory Visit: Payer: Self-pay | Admitting: Cardiovascular Disease

## 2017-11-03 ENCOUNTER — Other Ambulatory Visit: Payer: Self-pay | Admitting: Cardiovascular Disease

## 2017-11-16 ENCOUNTER — Ambulatory Visit (INDEPENDENT_AMBULATORY_CARE_PROVIDER_SITE_OTHER): Payer: Medicare Other | Admitting: Cardiovascular Disease

## 2017-11-16 ENCOUNTER — Encounter: Payer: Self-pay | Admitting: Cardiovascular Disease

## 2017-11-16 VITALS — BP 168/70 | HR 65 | Ht 61.0 in | Wt 142.2 lb

## 2017-11-16 DIAGNOSIS — I2510 Atherosclerotic heart disease of native coronary artery without angina pectoris: Secondary | ICD-10-CM

## 2017-11-16 DIAGNOSIS — I5022 Chronic systolic (congestive) heart failure: Secondary | ICD-10-CM | POA: Diagnosis not present

## 2017-11-16 DIAGNOSIS — I10 Essential (primary) hypertension: Secondary | ICD-10-CM

## 2017-11-16 DIAGNOSIS — E785 Hyperlipidemia, unspecified: Secondary | ICD-10-CM

## 2017-11-16 DIAGNOSIS — I255 Ischemic cardiomyopathy: Secondary | ICD-10-CM | POA: Diagnosis not present

## 2017-11-16 MED ORDER — CARVEDILOL 12.5 MG PO TABS: 12.5000 mg | ORAL_TABLET | Freq: Two times a day (BID) | ORAL | 1 refills | Status: DC

## 2017-11-16 MED ORDER — LISINOPRIL 40 MG PO TABS: 40.0000 mg | ORAL_TABLET | Freq: Every day | ORAL | 1 refills | Status: DC

## 2017-11-16 NOTE — Patient Instructions (Signed)
Medication Instructions: INCREASE the Carvedilol to 12.5 mg twice daily.  If you need a refill on your cardiac medications before your next appointment, please call your pharmacy.   Follow-Up: Your physician wants you to follow-up in 6 months with Dr. Fletcher Anon. You will receive a reminder letter in the mail two months in advance. If you don't receive a letter, please call our office at [phone] to schedule this follow-up appointment.   Thank you for choosing [facility] at [facility]!

## 2017-11-16 NOTE — Progress Notes (Signed)
Cardiology Office Note   Date:  11/16/2017   ID:  Kendra, Prince 05-09-1931, MRN [id number]  PCP:  Gearldine Shown, DO  Cardiologist:   Kathlyn Sacramento, MD   Chief Complaint  Patient presents with  . OTHER    6 month f/u no complaints today. Meds reviewed verbally with pt.      History of Present Illness: Kendra Prince is a 84 y.o. female who presents for  a follow-up visit regarding coronary artery disease. She has known history of hypertension and type 2 diabetes. She has previous history of palpitations controlled with atenolol. She presented in December 2015 with non-ST elevation myocardial infarction with anterior ischemic changes on EKG. She underwent cardiac catheterization which showed 99% stenosis in the mid LAD with ejection fraction of 40%. PCI and drug-eluting stent placement was performed to the LAD without complications.  She has known history of hyperlipidemia with known intolerance to all statins.  She could not afford Zetia.  She did not tolerate amlodipine due to flushing and palpitations.  She has been doing reasonably well with no recent chest pain or shortness of breath.  Her blood pressure continues to be elevated.  She describes occasional exertional palpitations.    Past Medical History:  Diagnosis Date  . Chronic systolic heart failure ([place])    a. 02/2014 LV gram: EF 40%, mod antlat HK, sev apical HK.  . Coronary artery disease    a. 02/2014 NSTEMI/PCI: LM nl, LAD 65m (2.5x33 Xience Alpine DES), LCX 50d, OM1/2 nl, RCA nl, RPDA nl, RPL1 nl, EF 40%.  . Diabetes mellitus without complication ([place])   . Hyperlipidemia    a. Statin intolerant; b. Could not afford Zetia.  . Hypertension   . Hypothyroidism   . Ischemic cardiomyopathy    a. 02/2014 LV gram: EF 40%, mod antlat HK, sev apical HK.  . MI (myocardial infarction) ([place]) 02/2014    Past Surgical History:  Procedure Laterality Date  . CARDIAC CATHETERIZATION  03/16/2014   x1  stent  . CARPAL TUNNEL RELEASE    . MOHS SURGERY    . SHOULDER SURGERY    . TONSILLECTOMY    . TOTAL ABDOMINAL HYSTERECTOMY    . TOTAL KNEE ARTHROPLASTY Left      Current Outpatient Medications  Medication Sig Dispense Refill  . albuterol (PROVENTIL HFA;VENTOLIN HFA) 108 (90 BASE) MCG/ACT inhaler Inhale 2 puffs into the lungs every 6 (six) hours as needed for wheezing or shortness of breath. 1 Inhaler 0  . ARGININE PO Take by mouth daily.    Marland Kitchen aspirin EC 81 MG tablet Take 1 tablet (81 mg total) by mouth daily. 90 tablet 3  . carvedilol (COREG) 12.5 MG tablet TAKE 1/2 (ONE-HALF) TABLET BY MOUTH IN THE MORNING AND 1 IN THE EVENING 135 tablet 1  . COD LIVER OIL/VITAMINS A & D CAPS Take by mouth daily.    Marland Kitchen levothyroxine (SYNTHROID, LEVOTHROID) 100 MCG tablet Take 100 mcg by mouth daily.    Marland Kitchen lisinopril (PRINIVIL,ZESTRIL) 40 MG tablet TAKE 1 TABLET BY MOUTH ONCE DAILY 90 tablet 0  . MAGNESIUM CITRATE PO Take 500 mg by mouth daily.    . nitroGLYCERIN (NITROSTAT) 0.4 MG SL tablet Place 0.4 mg under the tongue every 5 (five) minutes as needed for chest pain.     No current facility-administered medications for this visit.     Allergies:   Amlodipine; Azithromycin; Norvasc [amlodipine besylate]; Statins; Sulfa antibiotics; Tape; and Codeine  Social History:  The patient  reports that she has never smoked. She has never used smokeless tobacco. She reports that she does not drink alcohol or use drugs.   Family History:  The patient's family history includes Heart attack (age of onset: 20) in her brother; Heart attack (age of onset: 27) in her father.    ROS:  Please see the history of present illness.   Otherwise, review of systems are positive for none.   All other systems are reviewed and negative.    PHYSICAL EXAM: VS:  BP (!) 168/70 (BP Location: Left Arm, Patient Position: Sitting, Cuff Size: Normal)   Pulse 65   Ht 5\' 1"  (1.549 m)   Wt 142 lb 4 oz (64.5 kg)   BMI 26.88 kg/m  ,  BMI Body mass index is 26.88 kg/m. GEN: Well nourished, well developed, in no acute distress  HEENT: normal  Neck: no JVD, carotid bruits, or masses Cardiac: RRR; no murmurs, rubs, or gallops,no edema  Respiratory:  clear to auscultation bilaterally, normal work of breathing GI: soft, nontender, nondistended, + BS MS: no deformity or atrophy  Skin: warm and dry, no rash Neuro:  Strength and sensation are intact Psych: euthymic mood, full affect   EKG:  EKG is ordered today. The ekg ordered today demonstrates normal sinus rhythm with nonspecific IVCD with a left bundle branch block morphology.  Recent Labs: No results found for requested labs within last 8760 hours.    Lipid Panel    Component Value Date/Time   CHOL 200 (H) 09/20/2015 0928   CHOL 145 03/16/2014 0538   TRIG 156 (H) 09/20/2015 0928   TRIG 173 03/16/2014 0538   HDL 44 09/20/2015 0928   HDL 40 03/16/2014 0538   CHOLHDL 4.5 (H) 09/20/2015 0928   VLDL 35 03/16/2014 0538   LDLCALC 125 (H) 09/20/2015 0928   LDLCALC 70 03/16/2014 0538      Wt Readings from Last 3 Encounters:  11/16/17 142 lb 4 oz (64.5 kg)  05/20/17 140 lb (63.5 kg)  03/31/17 140 lb 8 oz (63.7 kg)        ASSESSMENT AND PLAN:  1.  Coronary artery disease involving native coronary arteries without angina:  She is doing well overall with no anginal symptoms. Continue aspirin indefinitely.   2. Hyperlipidemia:  Unfortunately, she is intolerant to all statins. She could not afford Zetia.  I am not certain that treatment with a PC SK 9 inhibitor would be helpful in this age group.  Cost is probably prohibitive anyway especially that she could not afford Zetia.  3. Essential hypertension:  Blood pressure is elevated.  I increase carvedilol to 12.5 mg twice daily.  If blood pressure continues to be elevated, the next step is to add spironolactone.   4. Chronic systolic heart failure: Ejection fraction was 40% of the time of myocardial infarction.  She is euvolemic without any diuretics. Continue treatment with carvedilol and lisinopril.   Disposition:   FU with me in 6 months  Signed,  Kathlyn Sacramento, MD  11/16/2017 2:24 PM    [place]

## 2018-01-03 DIAGNOSIS — M810 Age-related osteoporosis without current pathological fracture: Secondary | ICD-10-CM | POA: Insufficient documentation

## 2018-01-04 ENCOUNTER — Other Ambulatory Visit: Payer: Self-pay | Admitting: Pediatrics

## 2018-01-04 DIAGNOSIS — M810 Age-related osteoporosis without current pathological fracture: Secondary | ICD-10-CM

## 2018-03-21 ENCOUNTER — Other Ambulatory Visit: Payer: Self-pay | Admitting: Cardiovascular Disease

## 2018-04-08 ENCOUNTER — Other Ambulatory Visit: Payer: Self-pay | Admitting: Pediatrics

## 2018-04-08 DIAGNOSIS — M810 Age-related osteoporosis without current pathological fracture: Secondary | ICD-10-CM

## 2018-05-17 DIAGNOSIS — H6991 Unspecified Eustachian tube disorder, right ear: Secondary | ICD-10-CM | POA: Insufficient documentation

## 2018-05-17 DIAGNOSIS — H6981 Other specified disorders of Eustachian tube, right ear: Secondary | ICD-10-CM | POA: Insufficient documentation

## 2018-06-10 ENCOUNTER — Other Ambulatory Visit: Payer: Self-pay | Admitting: Cardiovascular Disease

## 2018-09-27 ENCOUNTER — Telehealth: Payer: Self-pay

## 2018-09-27 NOTE — Telephone Encounter (Signed)

## 2018-09-29 ENCOUNTER — Ambulatory Visit: Payer: Medicare Other | Admitting: Cardiovascular Disease

## 2018-10-31 NOTE — Progress Notes (Signed)
Cardiology Office Note    Date:  11/03/2018   ID:  JOSUE KASS, DOB [birth year], MRN [id number]  PCP:  Barbaraann Boys, MD  Cardiologist:  Kathlyn Sacramento, MD  Electrophysiologist:  None   Chief Complaint: Follow-up  History of Present Illness:   Kendra Prince is a 85 y.o. female with history of CAD with non-STEMI in 02/2014 status post PCI/DES to the LAD, HFrEF secondary to ICM, diabetes, hypertension, hyperlipidemia with statin intolerance, palpitations, hypothyroidism, multiple medication intolerances, and arthritis who presents for follow-up of her CAD and cardiomyopathy.  She was admitted to the hospital in 02/2014 for a non-STEMI with cardiac catheterization at that time showing 99% stenosis in the mid LAD with successful PCI/DES.  EF was 40% with moderate anterolateral hypokinesis and severe apical hypokinesis.  She was seen in 03/2017 with palpitations associated with right arm discomfort.  She underwent Lexiscan Myoview which showed no significant ischemia with a small region of mild fixed apical and apical septal perfusion defect consistent with prior MI versus attenuation artifact with an EF of 57%.  Overall, this is a low risk scan.  Patient has been intolerant to statins as well as amlodipine due to flushing and palpitations.  She has not been able to afford Zetia.  She was most recently seen in the office in 10/2017 and was doing reasonably well from a cardiac perspective without any chest pain or shortness of breath.  Her blood pressure continued to be elevated and she continued to note occasional exertional palpitations.  Carvedilol was increased to 12.5 mg twice daily.  She comes in doing reasonably well today.  She denies any chest pain.  She does have chronic shortness of breath which she feels like may be slightly worse compared to her last office visit in 10/2017.  She has attributed this to a sedentary lifestyle with the passing of her husband in 02/2018.  She has also been  snacking a little bit more.  She denies any palpitations, dizziness, presyncope, syncope.  She does note some bilateral pedal and ankle swelling which is typically significantly improved first thing in the morning and progresses as the day goes along.  She does not wear compression stockings or elevate her legs.  She denies any abdominal distention, orthopnea, PND, early satiety.  No falls since she was last seen.  No BRBPR or melena.  Home blood pressure log shows BP typically runs in the 1 teens to 458K systolic.  She plans on resuming a walking regimen though was concerned her bilateral hips may hinder this a little bit.  Labs: 04/2018 - A1c 6.6 12/2017 - potassium 4.6, serum creatinine 0.6, AST/ALT normal, albumin 4.3 07/2017 - TSH normal Hgb 13, PLT 266 06/2017 - total cholesterol 238, triglyceride 504, HDL 41, direct LDL 134  Past Medical History:  Diagnosis Date   Chronic systolic heart failure ([place])    a. 02/2014 LV gram: EF 40%, mod antlat HK, sev apical HK.   Coronary artery disease    a. 02/2014 NSTEMI/PCI: LM nl, LAD 16m (2.5x33 Xience Alpine DES), LCX 50d, OM1/2 nl, RCA nl, RPDA nl, RPL1 nl, EF 40%.   Diabetes mellitus without complication (HCC)    Hyperlipidemia    a. Statin intolerant; b. Could not afford Zetia.   Hypertension    Hypothyroidism    Ischemic cardiomyopathy    a. 02/2014 LV gram: EF 40%, mod antlat HK, sev apical HK.   MI (myocardial infarction) ([place]) 02/2014    Past Surgical  History:  Procedure Laterality Date   CARDIAC CATHETERIZATION  03/16/2014   x1 stent   CARPAL TUNNEL RELEASE     MOHS SURGERY     SHOULDER SURGERY     TONSILLECTOMY     TOTAL ABDOMINAL HYSTERECTOMY     TOTAL KNEE ARTHROPLASTY Left     Current Medications: Current Meds  Medication Sig   albuterol (PROVENTIL HFA;VENTOLIN HFA) 108 (90 BASE) MCG/ACT inhaler Inhale 2 puffs into the lungs every 6 (six) hours as needed for wheezing or shortness of breath.   ARGININE PO  Take by mouth daily.   aspirin EC 81 MG tablet Take 1 tablet (81 mg total) by mouth daily.   carvedilol (COREG) 12.5 MG tablet TAKE 1/2 (ONE-HALF) TABLET BY MOUTH IN THE MORNING AND 1 IN THE EVENING   COD LIVER OIL/VITAMINS A & D CAPS Take by mouth daily.   levothyroxine (SYNTHROID, LEVOTHROID) 100 MCG tablet Take 100 mcg by mouth daily.   lisinopril (PRINIVIL,ZESTRIL) 40 MG tablet Take 1 tablet (40 mg total) by mouth daily.   MAGNESIUM CITRATE PO Take 500 mg by mouth daily.   nitroGLYCERIN (NITROSTAT) 0.4 MG SL tablet Place 0.4 mg under the tongue every 5 (five) minutes as needed for chest pain.    Allergies:   Amlodipine, Azithromycin, Norvasc [amlodipine besylate], Statins, Sulfa antibiotics, Tape, and Codeine   Social History   Socioeconomic History   Marital status: Widowed    Spouse name: Not on file   Number of children: Not on file   Years of education: Not on file   Highest education level: Not on file  Occupational History   Not on file  Social Needs   Financial resource strain: Not on file   Food insecurity    Worry: Not on file    Inability: Not on file   Transportation needs    Medical: Not on file    Non-medical: Not on file  Tobacco Use   Smoking status: Never Smoker   Smokeless tobacco: Never Used  Substance and Sexual Activity   Alcohol use: No   Drug use: No   Sexual activity: Not on file  Lifestyle   Physical activity    Days per week: Not on file    Minutes per session: Not on file   Stress: Not on file  Relationships   Social connections    Talks on phone: Not on file    Gets together: Not on file    Attends religious service: Not on file    Active member of club or organization: Not on file    Attends meetings of clubs or organizations: Not on file    Relationship status: Not on file  Other Topics Concern   Not on file  Social History Narrative   Not on file     Family History:  The patient's family history  includes Heart attack (age of onset: 61) in her brother; Heart attack (age of onset: 36) in her father.  ROS:   Review of Systems  Constitutional: Positive for malaise/fatigue. Negative for chills, diaphoresis, fever and weight loss.  HENT: Negative for congestion.   Eyes: Negative for discharge and redness.  Respiratory: Positive for shortness of breath. Negative for cough, hemoptysis, sputum production and wheezing.   Cardiovascular: Positive for leg swelling. Negative for chest pain, palpitations, orthopnea, claudication and PND.  Gastrointestinal: Negative for abdominal pain, blood in stool, heartburn, melena, nausea and vomiting.  Genitourinary: Negative for hematuria.  Musculoskeletal: Positive for joint  pain. Negative for falls and myalgias.       Bilateral hip pain  Skin: Negative for rash.  Neurological: Positive for weakness. Negative for dizziness, tingling, tremors, sensory change, speech change, focal weakness and loss of consciousness.  Endo/Heme/Allergies: Does not bruise/bleed easily.  Psychiatric/Behavioral: Negative for substance abuse. The patient is not nervous/anxious.   All other systems reviewed and are negative.    EKGs/Labs/Other Studies Reviewed:    Studies reviewed were summarized above. The additional studies were reviewed today: As above.  EKG:  EKG is ordered today.  The EKG ordered today demonstrates NSR, 63 bpm, nonspecific IVCD with LBBB morphology (unchanged from prior)  Recent Labs: No results found for requested labs within last 8760 hours.  Recent Lipid Panel    Component Value Date/Time   CHOL 200 (H) 09/20/2015 0928   CHOL 145 03/16/2014 0538   TRIG 156 (H) 09/20/2015 0928   TRIG 173 03/16/2014 0538   HDL 44 09/20/2015 0928   HDL 40 03/16/2014 0538   CHOLHDL 4.5 (H) 09/20/2015 0928   VLDL 35 03/16/2014 0538   LDLCALC 125 (H) 09/20/2015 0928   LDLCALC 70 03/16/2014 0538    PHYSICAL EXAM:    VS:  BP (!) 142/72 (BP Location: Left Arm,  Patient Position: Sitting, Cuff Size: Normal)    Pulse 67    Ht 5' (1.524 m)    Wt 147 lb (66.7 kg)    SpO2 98%    BMI 28.71 kg/m   BMI: Body mass index is 28.71 kg/m.  Physical Exam  Constitutional: She is oriented to person, place, and time. She appears well-developed and well-nourished.  HENT:  Head: Normocephalic and atraumatic.  Eyes: Right eye exhibits no discharge. Left eye exhibits no discharge.  Neck: Normal range of motion. No JVD present.  Cardiovascular: Normal rate, regular rhythm, S1 normal, S2 normal and normal heart sounds. Exam reveals no distant heart sounds, no friction rub, no midsystolic click and no opening snap.  No murmur heard. Pulses:      Posterior tibial pulses are 2+ on the right side and 2+ on the left side.  Pulmonary/Chest: Effort normal and breath sounds normal. No respiratory distress. She has no decreased breath sounds. She has no wheezes. She has no rales. She exhibits no tenderness.  Abdominal: Soft. She exhibits no distension. There is no abdominal tenderness.  Musculoskeletal:        General: Edema present.     Comments: Trace bilateral pedal and ankle edema  Neurological: She is alert and oriented to person, place, and time.  Skin: Skin is warm and dry. No cyanosis. Nails show no clubbing.  Psychiatric: She has a normal mood and affect. Her speech is normal and behavior is normal. Judgment and thought content normal.    Wt Readings from Last 3 Encounters:  11/03/18 147 lb (66.7 kg)  11/16/17 142 lb 4 oz (64.5 kg)  05/20/17 140 lb (63.5 kg)     ASSESSMENT & PLAN:   1. CAD involving the native coronary arteries without angina: She is doing well without any angina. Continue secondary prevention with ASA, Coreg, and lisinopril. Intolerant to statins and previously could not afford Zetia as below.  No plans for ischemic evaluation at this time.  2. HFrEF secondary to ICM: EF was noted to be 40% at time of her MI in 2015 with subsequent  normalization by Myoview in 03/2017.  She appears euvolemic and well compensated.  Continue lisinopril and Coreg.  Not requiring standing  diuretic.  3. Shortness of breath: Likely multifactorial including advanced age, overweight, and physical deconditioning.  Check echo.  4. Lower extremity swelling: Likely in the setting of dependent edema with venous insufficiency.  Recommend leg elevation and compression stocking.  Obtain echo as above.  5. HTN: Blood pressure is slightly elevated in the office today though home blood pressure log shows most readings are in the 1 teens to 829H systolic.  Patient indicates she is somewhat stressed when she comes to the doctor's office.  In this setting, we will continue her on her current doses of Coreg and lisinopril.  6. HLD: Direct LDL of 139 from 06/2017 with a goal LDL < 70. She is intolerant to statins and previously could not afford Zetia. In this setting, I doubt she could afford a PCSK-9 inhibitor or bempedoic acid. Discussed [email or web address] and printed coupon for patient to obtain Zetia for $7.76 at [employer]. Heart healthy diet.  Recommend checking fasting lipid panel and liver function in follow-up.  Disposition: F/u with Dr. Fletcher Anon in 3 months..   Medication Adjustments/Labs and Tests Ordered: Current medicines are reviewed at length with the patient today.  Concerns regarding medicines are outlined above. Medication changes, Labs and Tests ordered today are summarized above and listed in the Patient Instructions accessible in Encounters.   Signed, Kendra Faith, PA-C 11/03/2018 1:33 PM     [facility] [address] [place], [place] [address] [phone]

## 2018-11-03 ENCOUNTER — Ambulatory Visit (INDEPENDENT_AMBULATORY_CARE_PROVIDER_SITE_OTHER): Payer: Medicare Other | Admitting: Physician Assistant

## 2018-11-03 ENCOUNTER — Other Ambulatory Visit: Payer: Self-pay

## 2018-11-03 ENCOUNTER — Encounter: Payer: Self-pay | Admitting: Physician Assistant

## 2018-11-03 VITALS — BP 142/72 | HR 67 | Ht 60.0 in | Wt 147.0 lb

## 2018-11-03 DIAGNOSIS — I10 Essential (primary) hypertension: Secondary | ICD-10-CM

## 2018-11-03 DIAGNOSIS — I255 Ischemic cardiomyopathy: Secondary | ICD-10-CM

## 2018-11-03 DIAGNOSIS — M7989 Other specified soft tissue disorders: Secondary | ICD-10-CM

## 2018-11-03 DIAGNOSIS — I5022 Chronic systolic (congestive) heart failure: Secondary | ICD-10-CM

## 2018-11-03 DIAGNOSIS — R0602 Shortness of breath: Secondary | ICD-10-CM

## 2018-11-03 DIAGNOSIS — I2510 Atherosclerotic heart disease of native coronary artery without angina pectoris: Secondary | ICD-10-CM | POA: Diagnosis not present

## 2018-11-03 DIAGNOSIS — E785 Hyperlipidemia, unspecified: Secondary | ICD-10-CM

## 2018-11-03 MED ORDER — EZETIMIBE 10 MG PO TABS: 10.0000 mg | ORAL_TABLET | Freq: Every day | ORAL | 3 refills | Status: DC

## 2018-11-03 NOTE — Patient Instructions (Signed)
Medication Instructions:  Your physician has recommended you make the following change in your medication:  1- START Zetia Take 1 tablet (10 mg total) by mouth daily.   If you need a refill on your cardiac medications before your next appointment, please call your pharmacy.   Lab work: None ordered If you have labs (blood work) drawn today and your tests are completely normal, you will receive your results only by: Marland Kitchen MyChart Message (if you have MyChart) OR . A paper copy in the mail If you have any lab test that is abnormal or we need to change your treatment, we will call you to review the results.  Testing/Procedures: 1- Echo  Please return to [facility] on ______________ at _______________ AM/PM for an Echocardiogram. Your physician has requested that you have an echocardiogram. Echocardiography is a painless test that uses sound waves to create images of your heart. It provides your doctor with information about the size and shape of your heart and how well your heart's chambers and valves are working. This procedure takes approximately one hour. There are no restrictions for this procedure. Please note; depending on visual quality an IV may need to be placed.    Follow-Up: At [facility], you and your health needs are our priority.  As part of our continuing mission to provide you with exceptional heart care, we have created designated Provider Care Teams.  These Care Teams include your primary Cardiologist (physician) and Advanced Practice Providers (APPs -  Physician Assistants and Nurse Practitioners) who all work together to provide you with the care you need, when you need it. You will need a follow up appointment in 3 months.  You may see Kathlyn Sacramento, MD or Christell Faith, PA-C.

## 2018-11-15 DIAGNOSIS — K219 Gastro-esophageal reflux disease without esophagitis: Secondary | ICD-10-CM | POA: Insufficient documentation

## 2018-11-15 DIAGNOSIS — K449 Diaphragmatic hernia without obstruction or gangrene: Secondary | ICD-10-CM | POA: Insufficient documentation

## 2018-11-21 ENCOUNTER — Other Ambulatory Visit: Payer: Self-pay | Admitting: Pediatrics

## 2018-11-21 DIAGNOSIS — M810 Age-related osteoporosis without current pathological fracture: Secondary | ICD-10-CM

## 2018-12-01 ENCOUNTER — Other Ambulatory Visit: Payer: Medicare Other

## 2018-12-22 ENCOUNTER — Other Ambulatory Visit: Payer: Self-pay

## 2018-12-22 ENCOUNTER — Ambulatory Visit (INDEPENDENT_AMBULATORY_CARE_PROVIDER_SITE_OTHER): Payer: Medicare Other

## 2018-12-22 DIAGNOSIS — I255 Ischemic cardiomyopathy: Secondary | ICD-10-CM

## 2018-12-23 ENCOUNTER — Telehealth: Payer: Self-pay

## 2018-12-23 DIAGNOSIS — I5022 Chronic systolic (congestive) heart failure: Secondary | ICD-10-CM

## 2018-12-23 DIAGNOSIS — I77819 Aortic ectasia, unspecified site: Secondary | ICD-10-CM

## 2018-12-23 NOTE — Telephone Encounter (Signed)
LMTCB re: her Echo results

## 2018-12-23 NOTE — Telephone Encounter (Signed)
-----   Message from Rise Mu, PA-C sent at 12/22/2018  8:13 PM EDT ----- Echo showed a slightly reduced pump function, mildly thickened left ventricle, slightly stiffened heart, moderately elevated pulmonary pressure and mildly dilated ascending aorta.   Recommendations: -Pump function is known to be slightly reduced following her MI in 2015, though Myoview in 2019 showed a low normal EF -She is scheduled to see Dr. Fletcher Anon in 01/2019, if she is feeling well currently we can keep that appointment. However, if she remains SOB or feels any worse we should see her sooner to discuss options -Follow up echo in 12 months to reassess mildly dilated ascending aorta

## 2018-12-28 NOTE — Telephone Encounter (Signed)
No answer. Left message to call back.   

## 2019-01-02 NOTE — Telephone Encounter (Signed)
Called patient and she verbalized understanding of the results. She does not report worsening SOB or other symptoms and feels like she is fine to wait until November. Order for repeat echo in 1 year entered.

## 2019-02-09 ENCOUNTER — Ambulatory Visit (INDEPENDENT_AMBULATORY_CARE_PROVIDER_SITE_OTHER): Payer: Medicare Other | Admitting: Cardiovascular Disease

## 2019-02-09 ENCOUNTER — Encounter: Payer: Self-pay | Admitting: Cardiovascular Disease

## 2019-02-09 ENCOUNTER — Other Ambulatory Visit: Payer: Self-pay

## 2019-02-09 VITALS — BP 140/70 | HR 66 | Ht 60.0 in | Wt 147.2 lb

## 2019-02-09 DIAGNOSIS — I10 Essential (primary) hypertension: Secondary | ICD-10-CM | POA: Diagnosis not present

## 2019-02-09 DIAGNOSIS — I255 Ischemic cardiomyopathy: Secondary | ICD-10-CM

## 2019-02-09 DIAGNOSIS — I2510 Atherosclerotic heart disease of native coronary artery without angina pectoris: Secondary | ICD-10-CM | POA: Diagnosis not present

## 2019-02-09 DIAGNOSIS — I5022 Chronic systolic (congestive) heart failure: Secondary | ICD-10-CM

## 2019-02-09 DIAGNOSIS — E7800 Pure hypercholesterolemia, unspecified: Secondary | ICD-10-CM

## 2019-02-09 NOTE — Progress Notes (Signed)
Cardiology Office Note   Date:  02/09/2019   ID:  TRU SAINZ, DOB [birth year], MRN [id number]  PCP:  Barbaraann Boys, MD  Cardiologist:   Kathlyn Sacramento, MD   Chief Complaint  Patient presents with  . other    3 month follow up. Meds reviewed by the pt. verbally. "doing well."      History of Present Illness: Kendra Prince is a 85 y.o. female who presents for  a follow-up visit regarding coronary artery disease. She has known history of hypertension and type 2 diabetes. She has previous history of palpitations controlled with atenolol. She presented in December 2015 with non-ST elevation myocardial infarction with anterior ischemic changes on EKG. She underwent cardiac catheterization which showed 99% stenosis in the mid LAD with ejection fraction of 40%. PCI and drug-eluting stent placement was performed to the LAD without complications.  She has known history of hyperlipidemia with known intolerance to all statins.   Most recent ischemic work-up was in January 2019 with a [facility] which showed no evidence of ischemia with normal ejection fraction at 57%.  She had an echocardiogram done in September of this year which showed an EF of 45 to 50% with moderate pulmonary hypertension with systolic pulmonary pressure of 46 mmHg. She was started on Zetia during last visit but she did not tolerate the medication due to GI side effects.  She was having episodes of dizziness which improved after she stopped L-arginine supplement.  Home blood pressure has been well controlled.  She is doing well with no chest pain or worsening dyspnea.   Past Medical History:  Diagnosis Date  . Chronic systolic heart failure ([place])    a. 02/2014 LV gram: EF 40%, mod antlat HK, sev apical HK.  . Coronary artery disease    a. 02/2014 NSTEMI/PCI: LM nl, LAD 50m (2.5x33 Xience Alpine DES), LCX 50d, OM1/2 nl, RCA nl, RPDA nl, RPL1 nl, EF 40%.  . Diabetes mellitus without complication ([place])   .  Hyperlipidemia    a. Statin intolerant; b. Could not afford Zetia.  . Hypertension   . Hypothyroidism   . Ischemic cardiomyopathy    a. 02/2014 LV gram: EF 40%, mod antlat HK, sev apical HK.  . MI (myocardial infarction) ([place]) 02/2014    Past Surgical History:  Procedure Laterality Date  . CARDIAC CATHETERIZATION  03/16/2014   x1 stent  . CARPAL TUNNEL RELEASE    . MOHS SURGERY    . SHOULDER SURGERY    . TONSILLECTOMY    . TOTAL ABDOMINAL HYSTERECTOMY    . TOTAL KNEE ARTHROPLASTY Left      Current Outpatient Medications  Medication Sig Dispense Refill  . albuterol (PROVENTIL HFA;VENTOLIN HFA) 108 (90 BASE) MCG/ACT inhaler Inhale 2 puffs into the lungs every 6 (six) hours as needed for wheezing or shortness of breath. 1 Inhaler 0  . aspirin EC 81 MG tablet Take 1 tablet (81 mg total) by mouth daily. 90 tablet 3  . b complex vitamins capsule Take 1 capsule by mouth daily.    . carvedilol (COREG) 12.5 MG tablet Take one tablet in the am & one-half tablet in the pm.    . COD LIVER OIL/VITAMINS A & D CAPS Take by mouth daily.    Marland Kitchen levothyroxine (SYNTHROID, LEVOTHROID) 100 MCG tablet Take 100 mcg by mouth daily.    Marland Kitchen lisinopril (PRINIVIL,ZESTRIL) 40 MG tablet Take 1 tablet (40 mg total) by mouth daily. 90 tablet 1  .  MAGNESIUM CITRATE PO Take 500 mg by mouth daily.    . nitroGLYCERIN (NITROSTAT) 0.4 MG SL tablet Place 0.4 mg under the tongue every 5 (five) minutes as needed for chest pain.     No current facility-administered medications for this visit.     Allergies:   Amlodipine, Azithromycin, Norvasc [amlodipine besylate], Statins, Sulfa antibiotics, Tape, and Codeine    Social History:  The patient  reports that she has never smoked. She has never used smokeless tobacco. She reports that she does not drink alcohol or use drugs.   Family History:  The patient's family history includes Heart attack (age of onset: 19) in her brother; Heart attack (age of onset: 51) in her father.     ROS:  Please see the history of present illness.   Otherwise, review of systems are positive for none.   All other systems are reviewed and negative.    PHYSICAL EXAM: VS:  BP 140/70 (BP Location: Left Arm, Patient Position: Sitting, Cuff Size: Normal)   Pulse 66   Ht 5' (1.524 m)   Wt 147 lb 4 oz (66.8 kg)   BMI 28.76 kg/m  , BMI Body mass index is 28.76 kg/m. GEN: Well nourished, well developed, in no acute distress  HEENT: normal  Neck: no JVD, carotid bruits, or masses Cardiac: RRR; no murmurs, rubs, or gallops,no edema  Respiratory:  clear to auscultation bilaterally, normal work of breathing GI: soft, nontender, nondistended, + BS MS: no deformity or atrophy  Skin: warm and dry, no rash Neuro:  Strength and sensation are intact Psych: euthymic mood, full affect   EKG:  EKG is ordered today. The ekg ordered today demonstrates normal sinus rhythm with left bundle branch block with a QRS duration of 122 ms.  She had incomplete left bundle branch block previously.  Recent Labs: No results found for requested labs within last 8760 hours.    Lipid Panel    Component Value Date/Time   CHOL 200 (H) 09/20/2015 0928   CHOL 145 03/16/2014 0538   TRIG 156 (H) 09/20/2015 0928   TRIG 173 03/16/2014 0538   HDL 44 09/20/2015 0928   HDL 40 03/16/2014 0538   CHOLHDL 4.5 (H) 09/20/2015 0928   VLDL 35 03/16/2014 0538   LDLCALC 125 (H) 09/20/2015 0928   LDLCALC 70 03/16/2014 0538      Wt Readings from Last 3 Encounters:  02/09/19 147 lb 4 oz (66.8 kg)  11/03/18 147 lb (66.7 kg)  11/16/17 142 lb 4 oz (64.5 kg)        ASSESSMENT AND PLAN:  1.  Coronary artery disease involving native coronary arteries without angina:  She is doing well overall with no anginal symptoms. Continue aspirin indefinitely.   2. Hyperlipidemia:  Unfortunately, she is intolerant to all statins.  Also she did not tolerate Zetia due to GI symptoms.  She seems to have allergies to multiple  medications.  Continue with lifestyle changes.  3. Essential hypertension: Home blood pressure readings are excellent.  Blood pressure is reasonably controlled here.  Continue lisinopril and carvedilol.  4. Chronic systolic heart failure: Most recent ejection fraction was 45 to 50%.  She appears to be euvolemic.  Continue carvedilol and lisinopril.    Disposition:   FU with me in 6 months  Signed,  Kathlyn Sacramento, MD  02/09/2019 10:07 AM    [place]

## 2019-02-09 NOTE — Patient Instructions (Addendum)
Medication Instructions:  Your physician recommends that you continue on your current medications as directed. Please refer to the Current Medication list given to you today.  *If you need a refill on your cardiac medications before your next appointment, please call your pharmacy*  Lab Work: Non ordered If you have labs (blood work) drawn today and your tests are completely normal, you will receive your results only by: Marland Kitchen MyChart Message (if you have MyChart) OR . A paper copy in the mail If you have any lab test that is abnormal or we need to change your treatment, we will call you to review the results.  Testing/Procedures: None ordered  Follow-Up: At [facility], you and your health needs are our priority.  As part of our continuing mission to provide you with exceptional heart care, we have created designated Provider Care Teams.  These Care Teams include your primary Cardiologist (physician) and Advanced Practice Providers (APPs -  Physician Assistants and Nurse Practitioners) who all work together to provide you with the care you need, when you need it.  Your next appointment:   6 months  The format for your next appointment:   In Person  Provider:    You may see Kathlyn Sacramento, MD or one of the following Advanced Practice Providers on your designated Care Team:    Murray Hodgkins, NP  Christell Faith, PA-C  Marrianne Mood, PA-C   Other Instructions N/A

## 2019-03-08 ENCOUNTER — Other Ambulatory Visit: Payer: Self-pay | Admitting: Cardiovascular Disease

## 2019-03-17 ENCOUNTER — Other Ambulatory Visit: Payer: Self-pay

## 2019-03-17 ENCOUNTER — Encounter: Payer: Self-pay | Admitting: Emergency Medicine

## 2019-03-17 ENCOUNTER — Ambulatory Visit
Admission: EM | Admit: 2019-03-17 | Discharge: 2019-03-17 | Disposition: A | Discharge status: Home / Self Care | Payer: Medicare Other | Attending: Family Medicine | Admitting: Family Medicine

## 2019-03-17 DIAGNOSIS — B372 Candidiasis of skin and nail: Secondary | ICD-10-CM | POA: Diagnosis present

## 2019-03-17 DIAGNOSIS — I5022 Chronic systolic (congestive) heart failure: Secondary | ICD-10-CM | POA: Insufficient documentation

## 2019-03-17 DIAGNOSIS — R0982 Postnasal drip: Secondary | ICD-10-CM | POA: Diagnosis not present

## 2019-03-17 DIAGNOSIS — Z7982 Long term (current) use of aspirin: Secondary | ICD-10-CM | POA: Diagnosis not present

## 2019-03-17 DIAGNOSIS — B370 Candidal stomatitis: Secondary | ICD-10-CM | POA: Diagnosis not present

## 2019-03-17 DIAGNOSIS — Z20828 Contact with and (suspected) exposure to other viral communicable diseases: Secondary | ICD-10-CM | POA: Insufficient documentation

## 2019-03-17 DIAGNOSIS — I110 Hypertensive heart disease with heart failure: Secondary | ICD-10-CM | POA: Insufficient documentation

## 2019-03-17 DIAGNOSIS — Z8249 Family history of ischemic heart disease and other diseases of the circulatory system: Secondary | ICD-10-CM | POA: Diagnosis not present

## 2019-03-17 DIAGNOSIS — J029 Acute pharyngitis, unspecified: Secondary | ICD-10-CM | POA: Diagnosis not present

## 2019-03-17 DIAGNOSIS — E039 Hypothyroidism, unspecified: Secondary | ICD-10-CM | POA: Insufficient documentation

## 2019-03-17 DIAGNOSIS — I252 Old myocardial infarction: Secondary | ICD-10-CM | POA: Diagnosis not present

## 2019-03-17 DIAGNOSIS — Z79899 Other long term (current) drug therapy: Secondary | ICD-10-CM | POA: Diagnosis not present

## 2019-03-17 DIAGNOSIS — E785 Hyperlipidemia, unspecified: Secondary | ICD-10-CM | POA: Diagnosis not present

## 2019-03-17 DIAGNOSIS — Z79890 Hormone replacement therapy (postmenopausal): Secondary | ICD-10-CM | POA: Insufficient documentation

## 2019-03-17 DIAGNOSIS — I2510 Atherosclerotic heart disease of native coronary artery without angina pectoris: Secondary | ICD-10-CM | POA: Insufficient documentation

## 2019-03-17 DIAGNOSIS — E119 Type 2 diabetes mellitus without complications: Secondary | ICD-10-CM | POA: Diagnosis not present

## 2019-03-17 LAB — RAPID STREP SCREEN (MED CTR MEBANE ONLY): Streptococcus, Group A Screen (Direct): NEGATIVE

## 2019-03-17 MED ORDER — NYSTATIN 100000 UNIT/GM EX POWD: 1.0000 "application " | Freq: Three times a day (TID) | CUTANEOUS | 0 refills | Status: DC

## 2019-03-17 MED ORDER — CLOTRIMAZOLE 10 MG MT TROC: 10.0000 mg | Freq: Every day | OROMUCOSAL | 0 refills | Status: DC

## 2019-03-17 NOTE — Discharge Instructions (Addendum)
Use medication as prescribed. Rest. Drink plenty of fluids. Rinse mouth after inhalers and nasal spray.   Follow up with your primary care physician this week as needed. Return to Urgent care for new or worsening concerns.

## 2019-03-17 NOTE — ED Triage Notes (Signed)
Patient c/o sore throat and white patches in her throat that started this morning.  Patient reports nasal congestion, drainage, and pressure that started earlier this week.  Patient denies fevers.

## 2019-03-17 NOTE — ED Provider Notes (Signed)
[facility] ____________________________________________  Time seen: Approximately 10:10 AM  I have reviewed the triage vital signs and the nursing notes.   HISTORY  Chief Complaint Sore Throat  HPI Kendra Prince is a 85 y.o. female presenting for evaluation of throat irritation.  States that she has had some postnasal drip this past week and she has been using her Flonase nasal spray as well as her albuterol inhaler intermittently as directed.  Patient reports she has noticed some irritation to her throat and a whitish appearance prompting her to come in.  Denies fevers, cough, chest pain, shortness of breath, known sick contacts.  Also does report rash underneath her right breast that she states she gets sometimes.  Has been applying cornstarch to that without resolution.  Denies other aggravating or alleviating factors.  Reports otherwise doing well.  Barbaraann Boys, MD : PCP   Past Medical History:  Diagnosis Date  . Chronic systolic heart failure ([place])    a. 02/2014 LV gram: EF 40%, mod antlat HK, sev apical HK.  . Coronary artery disease    a. 02/2014 NSTEMI/PCI: LM nl, LAD 35m (2.5x33 Xience Alpine DES), LCX 50d, OM1/2 nl, RCA nl, RPDA nl, RPL1 nl, EF 40%.  . Diabetes mellitus without complication ([place])   . Hyperlipidemia    a. Statin intolerant; b. Could not afford Zetia.  . Hypertension   . Hypothyroidism   . Ischemic cardiomyopathy    a. 02/2014 LV gram: EF 40%, mod antlat HK, sev apical HK.  . MI (myocardial infarction) ([place]) 02/2014    Patient Active Problem List   Diagnosis Date Noted  . Pneumonia, organism unspecified(486) 05/10/2014  . Hyperlipidemia   . Chronic systolic heart failure ([place])   . Coronary artery disease     Past Surgical History:  Procedure Laterality Date  . CARDIAC CATHETERIZATION  03/16/2014   x1 stent  . CARPAL TUNNEL RELEASE    . MOHS SURGERY    . SHOULDER SURGERY    . TONSILLECTOMY    . TOTAL ABDOMINAL  HYSTERECTOMY    . TOTAL KNEE ARTHROPLASTY Left      No current facility-administered medications for this encounter.  Current Outpatient Medications:  .  aspirin EC 81 MG tablet, Take 1 tablet (81 mg total) by mouth daily., Disp: 90 tablet, Rfl: 3 .  b complex vitamins capsule, Take 1 capsule by mouth daily., Disp: , Rfl:  .  carvedilol (COREG) 12.5 MG tablet, Take one tablet in the am & one-half tablet in the pm., Disp: , Rfl:  .  COD LIVER OIL/VITAMINS A & D CAPS, Take by mouth daily., Disp: , Rfl:  .  levothyroxine (SYNTHROID, LEVOTHROID) 100 MCG tablet, Take 100 mcg by mouth daily., Disp: , Rfl:  .  lisinopril (ZESTRIL) 40 MG tablet, Take 1 tablet by mouth once daily, Disp: 90 tablet, Rfl: 1 .  MAGNESIUM CITRATE PO, Take 500 mg by mouth daily., Disp: , Rfl:  .  albuterol (PROVENTIL HFA;VENTOLIN HFA) 108 (90 BASE) MCG/ACT inhaler, Inhale 2 puffs into the lungs every 6 (six) hours as needed for wheezing or shortness of breath., Disp: 1 Inhaler, Rfl: 0 .  clotrimazole (MYCELEX) 10 MG troche, Take 1 tablet (10 mg total) by mouth 5 (five) times daily., Disp: 70 Troche, Rfl: 0 .  nitroGLYCERIN (NITROSTAT) 0.4 MG SL tablet, Place 0.4 mg under the tongue every 5 (five) minutes as needed for chest pain., Disp: , Rfl:  .  nystatin (MYCOSTATIN/NYSTOP) powder, Apply 1 application  topically 3 (three) times daily., Disp: 15 g, Rfl: 0  Allergies Amlodipine, Azithromycin, Norvasc [amlodipine besylate], Statins, Sulfa antibiotics, Tape, and Codeine  Family History  Problem Relation Age of Onset  . Heart attack Father 22  . Heart attack Brother 51    Social History Social History   Tobacco Use  . Smoking status: Never Smoker  . Smokeless tobacco: Never Used  Substance Use Topics  . Alcohol use: No  . Drug use: No    Review of Systems Constitutional: No fever. ENT: Positive sore throat. Positive congestion.  Cardiovascular: Denies chest pain. Respiratory: Denies shortness of  breath. Gastrointestinal: No abdominal pain.  No nausea, no vomiting.  No diarrhea.   Musculoskeletal: Negative for back pain. Skin: Negative for rash.   ____________________________________________   PHYSICAL EXAM:  VITAL SIGNS: ED Triage Vitals  Enc Vitals Group     BP 03/17/19 1008 (!) 150/63     Pulse Rate 03/17/19 1008 63     Resp 03/17/19 1008 14     Temp 03/17/19 1008 98.4 F (36.9 C)     Temp Source 03/17/19 1008 Oral     SpO2 03/17/19 1008 97 %     Weight 03/17/19 0952 145 lb (65.8 kg)     Height 03/17/19 0952 5' (1.524 m)     Head Circumference --      Peak Flow --      Pain Score 03/17/19 0951 3     Pain Loc --      Pain Edu? --      Excl. in [place]? --     Constitutional: Alert and oriented. Well appearing and in no acute distress. Eyes: Conjunctivae are normal.  Head: Atraumatic. No sinus tenderness to palpation. No swelling. No erythema.  Ears: no erythema, normal TMs bilaterally.   Nose:Mild nasal congestion.   Mouth/Throat: Mucous membranes are moist. Mild pharyngeal erythema. No tonsillar swelling or exudate. Whitish film sides and dorsal posterior tongue.  Neck: No stridor.  No cervical spine tenderness to palpation. Hematological/Lymphatic/Immunilogical: No cervical lymphadenopathy. Cardiovascular: Normal rate, regular rhythm. Grossly normal heart sounds.  Good peripheral circulation. Respiratory: Normal respiratory effort.  No retractions. No wheezes, rales or rhonchi. Good air movement.  Musculoskeletal: Ambulatory with steady gait.  Neurologic:  Normal speech and language. No gait instability. Skin:  Skin appears warm, dry.  Except: Beneath right breast area of moist erythematous skin, nontender, no swelling, no surrounding erythema. Psychiatric: Mood and affect are normal. Speech and behavior are normal.   ___________________________________________   LABS (all labs ordered are listed, but only abnormal results are displayed)  Labs Reviewed   RAPID STREP SCREEN ([facility] ONLY)  NOVEL CORONAVIRUS, NAA (HOSP ORDER, SEND-OUT TO REF LAB; TAT 18-24 HRS)  CULTURE, GROUP A STREP [facility])    PROCEDURES Procedures    INITIAL IMPRESSION / ASSESSMENT AND PLAN / ED COURSE  Pertinent labs & imaging results that were available during my care of the patient were reviewed by me and considered in my medical decision making (see chart for details).  Well-appearing patient.  No acute distress.  Suspect oral thrush and skin Candida.  Will treat with clotrimazole lozenges and nystatin powder.  Discussed rinsing mouth post inhaler use as well as nasal steroid use.  Keep clean and dry allow open air and monitor.  Strep negative, will culture.  COVID-19 testing also completed.  Discussed indication, risks and benefits of medications with patient. Discussed follow up and return parameters including no resolution or  any worsening concerns. Patient verbalized understanding and agreed to plan.   ____________________________________________   FINAL CLINICAL IMPRESSION(S) / ED DIAGNOSES  Final diagnoses:  Candida, oral  Candidal skin infection     ED Discharge Orders         Ordered    clotrimazole (MYCELEX) 10 MG troche  5 times daily     03/17/19 1030    nystatin (MYCOSTATIN/NYSTOP) powder  3 times daily     03/17/19 1030           Note: This dictation was prepared with Dragon dictation along with smaller phrase technology. Any transcriptional errors that result from this process are unintentional.         Marylene Land, NP 03/17/19 1118

## 2019-03-18 LAB — NOVEL CORONAVIRUS, NAA (HOSP ORDER, SEND-OUT TO REF LAB; TAT 18-24 HRS): SARS-CoV-2, NAA: NOT DETECTED

## 2019-03-20 LAB — CULTURE, GROUP A STREP (THRC)

## 2019-07-10 ENCOUNTER — Other Ambulatory Visit: Payer: Self-pay | Admitting: Cardiovascular Disease

## 2019-08-15 ENCOUNTER — Encounter: Payer: Self-pay | Admitting: Cardiovascular Disease

## 2019-08-15 ENCOUNTER — Ambulatory Visit (INDEPENDENT_AMBULATORY_CARE_PROVIDER_SITE_OTHER): Payer: Medicare Other | Admitting: Cardiovascular Disease

## 2019-08-15 ENCOUNTER — Other Ambulatory Visit: Payer: Self-pay

## 2019-08-15 VITALS — BP 170/82 | HR 66 | Ht 60.0 in | Wt 146.2 lb

## 2019-08-15 DIAGNOSIS — E7800 Pure hypercholesterolemia, unspecified: Secondary | ICD-10-CM

## 2019-08-15 DIAGNOSIS — I2510 Atherosclerotic heart disease of native coronary artery without angina pectoris: Secondary | ICD-10-CM

## 2019-08-15 DIAGNOSIS — I5022 Chronic systolic (congestive) heart failure: Secondary | ICD-10-CM | POA: Diagnosis not present

## 2019-08-15 DIAGNOSIS — I10 Essential (primary) hypertension: Secondary | ICD-10-CM

## 2019-08-15 MED ORDER — FUROSEMIDE 20 MG PO TABS: 20.0000 mg | ORAL_TABLET | Freq: Every day | ORAL | 5 refills | Status: DC

## 2019-08-15 NOTE — Progress Notes (Signed)
Cardiology Office Note   Date:  08/15/2019   ID:  KELLY TYSON, DOB 05/30/1931, MRN [id number]  PCP:  Barbaraann Boys, MD  Cardiologist:   Kathlyn Sacramento, MD   Chief Complaint  Patient presents with  . office visit    Pt having SOB/ swelling. Meds verbally reviewed w/ pt.      History of Present Illness: Kendra Prince is a 86 y.o. female who presents for  a follow-up visit regarding coronary artery disease. She has known history of hypertension and type 2 diabetes. She has history of palpitations controlled with atenolol. She presented in December 2015 with non-ST elevation myocardial infarction with anterior ischemic changes on EKG. She underwent cardiac catheterization which showed 99% stenosis in the mid LAD with ejection fraction of 40%. PCI and drug-eluting stent placement was performed to the LAD without complications.  She has known history of hyperlipidemia with known intolerance to all statins.   Most recent ischemic work-up was in January 2019 with a [facility] which showed no evidence of ischemia with normal ejection fraction at 57%.  She had an echocardiogram done in September 2020 which showed an EF of 45 to 50% with moderate pulmonary hypertension with systolic pulmonary pressure of 46 mmHg. She reports worsening exertional dyspnea and leg edema without chest pain.  Her blood pressure is elevated today but I reviewed her home blood pressure readings at home and they were almost all in the normal range.  She feels very anxious when she comes to the physician's office. She reports worsening numbness in her feet and she thinks it is due to neuropathy.   Past Medical History:  Diagnosis Date  . Chronic systolic heart failure ([place])    a. 02/2014 LV gram: EF 40%, mod antlat HK, sev apical HK.  . Coronary artery disease    a. 02/2014 NSTEMI/PCI: LM nl, LAD 28m (2.5x33 Xience Alpine DES), LCX 50d, OM1/2 nl, RCA nl, RPDA nl, RPL1 nl, EF 40%.  . Diabetes mellitus  without complication ([place])   . Hyperlipidemia    a. Statin intolerant; b. Could not afford Zetia.  . Hypertension   . Hypothyroidism   . Ischemic cardiomyopathy    a. 02/2014 LV gram: EF 40%, mod antlat HK, sev apical HK.  . MI (myocardial infarction) ([place]) 02/2014    Past Surgical History:  Procedure Laterality Date  . CARDIAC CATHETERIZATION  03/16/2014   x1 stent  . CARPAL TUNNEL RELEASE    . MOHS SURGERY    . SHOULDER SURGERY    . TONSILLECTOMY    . TOTAL ABDOMINAL HYSTERECTOMY    . TOTAL KNEE ARTHROPLASTY Left      Current Outpatient Medications  Medication Sig Dispense Refill  . albuterol (PROVENTIL HFA;VENTOLIN HFA) 108 (90 BASE) MCG/ACT inhaler Inhale 2 puffs into the lungs every 6 (six) hours as needed for wheezing or shortness of breath. 1 Inhaler 0  . aspirin EC 81 MG tablet Take 1 tablet (81 mg total) by mouth daily. 90 tablet 3  . b complex vitamins capsule Take 1 capsule by mouth daily.    . carvedilol (COREG) 12.5 MG tablet Take 1 tablet (12.5 mg total) by mouth as directed. Take 1 tablet in the AM and one-half tablet in the evening as directed 135 tablet 0  . clotrimazole (MYCELEX) 10 MG troche Take 1 tablet (10 mg total) by mouth 5 (five) times daily. 70 Troche 0  . COD LIVER OIL/VITAMINS A & D CAPS Take by  mouth daily.    Marland Kitchen levothyroxine (SYNTHROID, LEVOTHROID) 100 MCG tablet Take 100 mcg by mouth daily.    Marland Kitchen lisinopril (ZESTRIL) 40 MG tablet Take 1 tablet by mouth once daily 90 tablet 1  . MAGNESIUM CITRATE PO Take 500 mg by mouth daily.    . nitroGLYCERIN (NITROSTAT) 0.4 MG SL tablet Place 0.4 mg under the tongue every 5 (five) minutes as needed for chest pain.    Marland Kitchen nystatin (MYCOSTATIN/NYSTOP) powder Apply 1 application topically 3 (three) times daily. (Patient not taking: Reported on 08/15/2019) 15 g 0   No current facility-administered medications for this visit.    Allergies:   Amlodipine, Azithromycin, Norvasc [amlodipine besylate], Statins, Sulfa  antibiotics, Tape, and Codeine    Social History:  The patient  reports that she has never smoked. She has never used smokeless tobacco. She reports that she does not drink alcohol or use drugs.   Family History:  The patient's family history includes Heart attack (age of onset: 60) in her brother; Heart attack (age of onset: 87) in her father.    ROS:  Please see the history of present illness.   Otherwise, review of systems are positive for none.   All other systems are reviewed and negative.    PHYSICAL EXAM: VS:  BP (!) 170/82 (BP Location: Left Arm, Patient Position: Sitting, Cuff Size: Normal)   Pulse 66   Wt 146 lb 4 oz (66.3 kg)   BMI 28.56 kg/m  , BMI Body mass index is 28.56 kg/m. GEN: Well nourished, well developed, in no acute distress  HEENT: normal  Neck: no JVD, carotid bruits, or masses Cardiac: RRR; no murmurs, rubs, or gallops, mild bilateral leg edema Respiratory:  clear to auscultation bilaterally, normal work of breathing GI: soft, nontender, nondistended, + BS MS: no deformity or atrophy  Skin: warm and dry, no rash Neuro:  Strength and sensation are intact Psych: euthymic mood, full affect Dorsalis pedis is normal bilaterally  EKG:  EKG is ordered today. The ekg ordered today demonstrates normal sinus rhythm with left bundle branch block with a QRS duration of 132 ms  Recent Labs: No results found for requested labs within last 8760 hours.    Lipid Panel    Component Value Date/Time   CHOL 200 (H) 09/20/2015 0928   CHOL 145 03/16/2014 0538   TRIG 156 (H) 09/20/2015 0928   TRIG 173 03/16/2014 0538   HDL 44 09/20/2015 0928   HDL 40 03/16/2014 0538   CHOLHDL 4.5 (H) 09/20/2015 0928   VLDL 35 03/16/2014 0538   LDLCALC 125 (H) 09/20/2015 0928   LDLCALC 70 03/16/2014 0538      Wt Readings from Last 3 Encounters:  08/15/19 146 lb 4 oz (66.3 kg)  03/17/19 145 lb (65.8 kg)  02/09/19 147 lb 4 oz (66.8 kg)        ASSESSMENT AND PLAN:  1.   Coronary artery disease involving native coronary arteries without angina:  She is doing well overall with no anginal symptoms. Continue aspirin indefinitely.   2. Hyperlipidemia:  Unfortunately, she is intolerant to all statins.  Also she did not tolerate Zetia due to GI symptoms.  She seems to have allergies to multiple medications.  Continue with lifestyle changes.  3. Essential hypertension: Blood pressure is very high today but home blood pressure readings are normal.  I elected to make no changes.  4. Chronic systolic heart failure: Most recent ejection fraction was 45 to 50%.  She appears  to be mildly volume overloaded which might be responsible for her worsening exertional dyspnea and leg edema.  I elected to add furosemide 20 mg daily.  Check basic metabolic profile in 1 week.    Continue carvedilol and lisinopril.  She does have underlying left bundle branch block and we should consider repeat echocardiogram later this year.    Disposition:   FU with me in 6 months  Signed,  Kathlyn Sacramento, MD  08/15/2019 2:43 PM    [place]

## 2019-08-15 NOTE — Patient Instructions (Signed)
Medication Instructions:  Your physician has recommended you make the following change in your medication:   START Lasix 20 mg daily. An Rx has been sent to your pharmacy.  *If you need a refill on your cardiac medications before your next appointment, please call your pharmacy*   Lab Work: Your physician recommends that you return for lab work in: 1 week. Please have your lab drawn at the [facility] medical mall. No appointment needed. Lab hours are Mon-Fri 7am-6pm.  If you have labs (blood work) drawn today and your tests are completely normal, you will receive your results only by: Marland Kitchen MyChart Message (if you have MyChart) OR . A paper copy in the mail If you have any lab test that is abnormal or we need to change your treatment, we will call you to review the results.   Testing/Procedures: None ordered   Follow-Up: At [facility], you and your health needs are our priority.  As part of our continuing mission to provide you with exceptional heart care, we have created designated Provider Care Teams.  These Care Teams include your primary Cardiologist (physician) and Advanced Practice Providers (APPs -  Physician Assistants and Nurse Practitioners) who all work together to provide you with the care you need, when you need it.  We recommend signing up for the patient portal called "MyChart".  Sign up information is provided on this After Visit Summary.  MyChart is used to connect with patients for Virtual Visits (Telemedicine).  Patients are able to view lab/test results, encounter notes, upcoming appointments, etc.  Non-urgent messages can be sent to your provider as well.   To learn more about what you can do with MyChart, go to [email or web address].    Your next appointment:   6 month(s)  The format for your next appointment:   In Person  Provider:    You may see Kathlyn Sacramento, MD or one of the following Advanced Practice Providers on your designated Care Team:    Murray Hodgkins, NP  Christell Faith, PA-C  Marrianne Mood, PA-C    Other Instructions N/A

## 2019-08-24 ENCOUNTER — Other Ambulatory Visit
Admission: RE | Admit: 2019-08-24 | Discharge: 2019-08-24 | Disposition: A | Discharge status: Home / Self Care | Payer: Medicare Other | Source: Ambulatory Visit | Attending: Cardiovascular Disease | Admitting: Cardiovascular Disease

## 2019-08-24 DIAGNOSIS — I5022 Chronic systolic (congestive) heart failure: Secondary | ICD-10-CM | POA: Diagnosis present

## 2019-08-24 LAB — BASIC METABOLIC PANEL
Anion gap: 8 (ref 5–15)
BUN: 16 mg/dL (ref 8–23)
CO2: 28 mmol/L (ref 22–32)
Calcium: 8.9 mg/dL (ref 8.9–10.3)
Chloride: 97 mmol/L — ABNORMAL LOW (ref 98–111)
Creatinine, Ser: 0.64 mg/dL (ref 0.44–1.00)
GFR calc Af Amer: >60 mL/min (ref >60)
GFR calc non Af Amer: >60 mL/min (ref >60)
Glucose, Bld: 111 mg/dL — ABNORMAL HIGH (ref 70–99)
Potassium: 4.6 mmol/L (ref 3.5–5.1)
Sodium: 133 mmol/L — ABNORMAL LOW (ref 135–145)

## 2019-10-05 DIAGNOSIS — G63 Polyneuropathy in diseases classified elsewhere: Secondary | ICD-10-CM | POA: Insufficient documentation

## 2019-10-06 MED ORDER — CARVEDILOL 6.25 MG PO TABS: 6.2500 mg | ORAL_TABLET | Freq: Two times a day (BID) | ORAL | Status: DC

## 2019-12-31 ENCOUNTER — Other Ambulatory Visit: Payer: Self-pay | Admitting: Cardiovascular Disease

## 2020-01-01 ENCOUNTER — Other Ambulatory Visit: Payer: Self-pay | Admitting: Cardiovascular Disease

## 2020-01-01 MED ORDER — CARVEDILOL 3.125 MG PO TABS: 3.1250 mg | ORAL_TABLET | Freq: Two times a day (BID) | ORAL | 1 refills | Status: DC

## 2020-02-29 ENCOUNTER — Ambulatory Visit: Payer: Medicare Other | Admitting: Cardiovascular Disease

## 2020-03-26 ENCOUNTER — Other Ambulatory Visit: Payer: Self-pay | Admitting: Cardiovascular Disease

## 2020-04-05 ENCOUNTER — Encounter: Payer: Self-pay | Admitting: Cardiovascular Disease

## 2020-04-05 ENCOUNTER — Telehealth (INDEPENDENT_AMBULATORY_CARE_PROVIDER_SITE_OTHER): Payer: Medicare Other | Admitting: Cardiovascular Disease

## 2020-04-05 ENCOUNTER — Other Ambulatory Visit: Payer: Self-pay

## 2020-04-05 VITALS — BP 113/55 | HR 76 | Ht 60.0 in | Wt 142.0 lb

## 2020-04-05 DIAGNOSIS — E785 Hyperlipidemia, unspecified: Secondary | ICD-10-CM | POA: Diagnosis not present

## 2020-04-05 DIAGNOSIS — I2510 Atherosclerotic heart disease of native coronary artery without angina pectoris: Secondary | ICD-10-CM

## 2020-04-05 DIAGNOSIS — I10 Essential (primary) hypertension: Secondary | ICD-10-CM | POA: Diagnosis not present

## 2020-04-05 DIAGNOSIS — I5022 Chronic systolic (congestive) heart failure: Secondary | ICD-10-CM

## 2020-04-05 MED ORDER — CARVEDILOL 3.125 MG PO TABS: 3.1250 mg | ORAL_TABLET | Freq: Two times a day (BID) | ORAL | 3 refills | Status: DC

## 2020-04-05 MED ORDER — FUROSEMIDE 20 MG PO TABS: 20.0000 mg | ORAL_TABLET | Freq: Every day | ORAL | 3 refills | Status: DC

## 2020-04-05 MED ORDER — LISINOPRIL 40 MG PO TABS: 40.0000 mg | ORAL_TABLET | Freq: Every day | ORAL | 3 refills | Status: DC

## 2020-04-05 NOTE — Patient Instructions (Signed)
Medication Instructions:  Continue same medications  *If you need a refill on your cardiac medications before your next appointment, please call your pharmacy*   Lab Work:  If you have labs (blood work) drawn today and your tests are completely normal, you will receive your results only by: Marland Kitchen MyChart Message (if you have MyChart) OR . A paper copy in the mail If you have any lab test that is abnormal or we need to change your treatment, we will call you to review the results.   Testing/Procedures: none   Follow-Up: At [facility], you and your health needs are our priority.  As part of our continuing mission to provide you with exceptional heart care, we have created designated Provider Care Teams.  These Care Teams include your primary Cardiologist (physician) and Advanced Practice Providers (APPs -  Physician Assistants and Nurse Practitioners) who all work together to provide you with the care you need, when you need it.  We recommend signing up for the patient portal called "MyChart".  Sign up information is provided on this After Visit Summary.  MyChart is used to connect with patients for Virtual Visits (Telemedicine).  Patients are able to view lab/test results, encounter notes, upcoming appointments, etc.  Non-urgent messages can be sent to your provider as well.   To learn more about what you can do with MyChart, go to [email or web address].    Your next appointment:   4 month(s)  The format for your next appointment:   In Person  Provider:   You may see Kathlyn Sacramento, MD or one of the following Advanced Practice Providers on your designated Care Team:    Murray Hodgkins, NP  Christell Faith, PA-C  Marrianne Mood, PA-C  Cadence Stanfield, [place]  Laurann Montana, NP

## 2020-04-05 NOTE — Progress Notes (Signed)
Virtual Visit via Video Note   This visit type was conducted due to national recommendations for restrictions regarding the COVID-19 Pandemic (e.g. social distancing) in an effort to limit this patient's exposure and mitigate transmission in our community.  Due to her co-morbid illnesses, this patient is at least at moderate risk for complications without adequate follow up.  This format is felt to be most appropriate for this patient at this time.  All issues noted in this document were discussed and addressed.  A limited physical exam was performed with this format.  Please refer to the patient's chart for her consent to telehealth for [facility].       Date:  04/05/2020   ID:  Kendra Prince, DOB 1931/10/05, MRN [id number] The patient was identified using 2 identifiers.  Patient Location: Home Provider Location: Office/Clinic  PCP:  Barbaraann Boys, MD  Cardiologist:  Kathlyn Sacramento, MD  Electrophysiologist:  None   Evaluation Performed:  Follow-Up Visit  Chief Complaint: Doing well with no complaints.  History of Present Illness:    Kendra Prince is a 87 y.o. female was reached via video visit for follow-up regarding coronary artery disease and heart failure.  She has known history of hypertension and type 2 diabetes. She has history of palpitations controlled with atenolol. She presented in December 2015 with non-ST elevation myocardial infarction with anterior ischemic changes on EKG. She underwent cardiac catheterization which showed 99% stenosis in the mid LAD with ejection fraction of 40%. PCI and drug-eluting stent placement was performed to the LAD without complications.  She has known history of hyperlipidemia with known intolerance to all statins.   Most recent ischemic work-up was in January 2019 with a [facility] which showed no evidence of ischemia with normal ejection fraction at 57%.  She had an echocardiogram done in September 2020 which showed an EF of  45 to 50% with moderate pulmonary hypertension with systolic pulmonary pressure of 46 mmHg. She was most recently seen in May of last year and had worsening exertional dyspnea.  A small dose furosemide was added and subsequently she lost 3 pounds with improvement in symptoms.  She is feeling well at the present time with no chest pain or significant dyspnea.  No orthopnea, PND or leg edema.   The patient does not have symptoms concerning for COVID-19 infection (fever, chills, cough, or new shortness of breath).    Past Medical History:  Diagnosis Date  . Chronic systolic heart failure ([place])    a. 02/2014 LV gram: EF 40%, mod antlat HK, sev apical HK.  . Coronary artery disease    a. 02/2014 NSTEMI/PCI: LM nl, LAD 55m (2.5x33 Xience Alpine DES), LCX 50d, OM1/2 nl, RCA nl, RPDA nl, RPL1 nl, EF 40%.  . Diabetes mellitus without complication ([place])   . Hyperlipidemia    a. Statin intolerant; b. Could not afford Zetia.  . Hypertension   . Hypothyroidism   . Ischemic cardiomyopathy    a. 02/2014 LV gram: EF 40%, mod antlat HK, sev apical HK.  . MI (myocardial infarction) ([place]) 02/2014   Past Surgical History:  Procedure Laterality Date  . CARDIAC CATHETERIZATION  03/16/2014   x1 stent  . CARPAL TUNNEL RELEASE    . MOHS SURGERY    . SHOULDER SURGERY    . TONSILLECTOMY    . TOTAL ABDOMINAL HYSTERECTOMY    . TOTAL KNEE ARTHROPLASTY Left      Current Meds  Medication Sig  . albuterol (  PROVENTIL HFA;VENTOLIN HFA) 108 (90 BASE) MCG/ACT inhaler Inhale 2 puffs into the lungs every 6 (six) hours as needed for wheezing or shortness of breath.  Marland Kitchen aspirin EC 81 MG tablet Take 1 tablet (81 mg total) by mouth daily.  Marland Kitchen b complex vitamins capsule Take 1 capsule by mouth daily.  . carvedilol (COREG) 3.125 MG tablet Take 1 tablet (3.125 mg total) by mouth 2 (two) times daily with a meal.  . COD LIVER OIL/VITAMINS A & D CAPS Take by mouth daily.  . furosemide (LASIX) 20 MG tablet TAKE 1 TABLET BY  MOUTH EVERY DAY  . levothyroxine (SYNTHROID, LEVOTHROID) 100 MCG tablet Take 100 mcg by mouth daily.  Marland Kitchen lisinopril (ZESTRIL) 40 MG tablet Take 1 tablet by mouth once daily  . MAGNESIUM CITRATE PO Take 500 mg by mouth daily.  . nitroGLYCERIN (NITROSTAT) 0.4 MG SL tablet Place 0.4 mg under the tongue every 5 (five) minutes as needed for chest pain.     Allergies:   Amlodipine, Azithromycin, Norvasc [amlodipine besylate], Statins, Sulfa antibiotics, Tape, and Codeine   Social History   Tobacco Use  . Smoking status: Never Smoker  . Smokeless tobacco: Never Used  Vaping Use  . Vaping Use: Never used  Substance Use Topics  . Alcohol use: No  . Drug use: No     Family Hx: The patient's family history includes Heart attack (age of onset: 50) in her brother; Heart attack (age of onset: 87) in her father.  ROS:   Please see the history of present illness.     All other systems reviewed and are negative.   Prior CV studies:   The following studies were reviewed today:    Labs/Other Tests and Data Reviewed:    EKG:  No ECG reviewed.  Recent Labs: 08/24/2019: BUN 16; Creatinine, Ser 0.64; Potassium 4.6; Sodium 133   Recent Lipid Panel Lab Results  Component Value Date/Time   CHOL 200 (H) 09/20/2015 09:28 AM   CHOL 145 03/16/2014 05:38 AM   TRIG 156 (H) 09/20/2015 09:28 AM   TRIG 173 03/16/2014 05:38 AM   HDL 44 09/20/2015 09:28 AM   HDL 40 03/16/2014 05:38 AM   CHOLHDL 4.5 (H) 09/20/2015 09:28 AM   LDLCALC 125 (H) 09/20/2015 09:28 AM   LDLCALC 70 03/16/2014 05:38 AM    Wt Readings from Last 3 Encounters:  04/05/20 142 lb (64.4 kg)  08/15/19 146 lb 4 oz (66.3 kg)  03/17/19 145 lb (65.8 kg)     Risk Assessment/Calculations:      Objective:    Vital Signs:  BP (!) 113/55   Pulse 76   Ht 5' (1.524 m)   Wt 142 lb (64.4 kg)   BMI 27.73 kg/m    VITAL SIGNS:  reviewed  ASSESSMENT & PLAN:    1.  Coronary artery disease involving native coronary arteries without  angina:  She is doing well overall with no anginal symptoms. Continue aspirin indefinitely.   2. Hyperlipidemia:  Unfortunately, she is intolerant to all statins.  Also she did not tolerate Zetia due to GI symptoms.  She seems to have allergies to multiple medications.  Continue with lifestyle changes.  3. Essential hypertension: She likely has whitecoat syndrome as her blood pressure usually very high in the office but well controlled at home. Continue same medications  4. Chronic systolic heart failure: Most recent ejection fraction was 45 to 50%. Volume overload improved significantly with the addition of small dose furosemide 20 mg  once daily.        Time:   Today, I have spent 10 minutes with the patient with telehealth technology discussing the above problems.     Medication Adjustments/Labs and Tests Ordered: Current medicines are reviewed at length with the patient today.  Concerns regarding medicines are outlined above.   Tests Ordered: No orders of the defined types were placed in this encounter.   Medication Changes: No orders of the defined types were placed in this encounter.   Follow Up:  In Person in 4 month(s)  Signed, Kathlyn Sacramento, MD  04/05/2020 9:41 AM    [place]

## 2020-08-22 ENCOUNTER — Ambulatory Visit: Payer: Medicare Other | Admitting: Cardiovascular Disease

## 2020-09-04 ENCOUNTER — Ambulatory Visit: Payer: Medicare Other | Admitting: Physician Assistant

## 2020-09-05 ENCOUNTER — Other Ambulatory Visit: Payer: Self-pay

## 2020-10-18 ENCOUNTER — Other Ambulatory Visit: Payer: Self-pay

## 2020-10-18 ENCOUNTER — Encounter: Payer: Self-pay | Admitting: Nurse Practitioner

## 2020-10-18 ENCOUNTER — Ambulatory Visit (INDEPENDENT_AMBULATORY_CARE_PROVIDER_SITE_OTHER): Payer: Medicare Other | Admitting: Nurse Practitioner

## 2020-10-18 VITALS — BP 140/80 | HR 64 | Ht 60.0 in | Wt 140.0 lb

## 2020-10-18 DIAGNOSIS — I255 Ischemic cardiomyopathy: Secondary | ICD-10-CM | POA: Diagnosis not present

## 2020-10-18 DIAGNOSIS — I2510 Atherosclerotic heart disease of native coronary artery without angina pectoris: Secondary | ICD-10-CM | POA: Diagnosis not present

## 2020-10-18 DIAGNOSIS — I10 Essential (primary) hypertension: Secondary | ICD-10-CM

## 2020-10-18 DIAGNOSIS — I5022 Chronic systolic (congestive) heart failure: Secondary | ICD-10-CM

## 2020-10-18 DIAGNOSIS — E785 Hyperlipidemia, unspecified: Secondary | ICD-10-CM

## 2020-10-18 NOTE — Progress Notes (Signed)
Office Visit    Patient Name: Kendra Prince Date of Encounter: 10/18/2020  Primary Care Provider:  Barbaraann Boys, MD Primary Cardiologist:  Kathlyn Sacramento, MD  Chief Complaint    87 year old female with a history of CAD status post non-STEMI and drug-eluting stent placement to the LAD in 2015, hypertension, hyperlipidemia (statin intolerant), ischemic cardiomyopathy, HFrEF, LBBB, diabetes, and hypothyroidism, who presents for follow-up of CAD.  Past Medical History    Past Medical History:  Diagnosis Date   Chronic systolic heart failure ([place])    a. 02/2014 LV gram: EF 40%, mod antlat HK, sev apical HK; b. 03/2017 MV: EF 57%; c. 11/2018 Echo: EF 45-50%, impaired relaxation. Nl RV size/fxn. Mildly dil LA. Mod elev PASP. Asc Ao 76m.   Coronary artery disease    a. 02/2014 NSTEMI/PCI: LM nl, LAD 930m2.5x33 Xience Alpine DES), LCX 50d, OM1/2 nl, RCA nl, RPDA nl, RPL1 nl, EF 40%; b. 03/2017 MV: EF 57%. Fixed apical and apical septal defect - MI vs attenuation. No ischemia.   Diabetes mellitus without complication (HCC)    Hyperlipidemia    a. Statin intolerant; b. Could not afford Zetia.   Hypertension    Hypothyroidism    Ischemic cardiomyopathy    a. 02/2014 LV gram: EF 40%, mod antlat HK, sev apical HK.   LBBB (left bundle branch block)    [place]   Past Surgical History:  Procedure Laterality Date   CARDIAC CATHETERIZATION  03/16/2014   x1 stent   CARPAL TUNNEL RELEASE     MOHS SURGERY     SHOULDER SURGERY     TONSILLECTOMY     TOTAL ABDOMINAL HYSTERECTOMY     TOTAL KNEE ARTHROPLASTY Left     Allergies  Allergies  Allergen Reactions   Amlodipine Other (See Comments) and Palpitations   Azithromycin Other (See Comments)    Other reaction(s): Vomiting   Norvasc [Amlodipine Besylate]     Shakes     Statins Other (See Comments)    Myalgia Crestor and Atorvastatin   Sulfa Antibiotics Itching   Tape    Codeine Other (See  Comments) and Rash    hallucinations    History of Present Illness    [age]ear old female with the above past medical history including CAD, hypertension, hyperlipidemia, statin intolerance, ischemic cardiomyopathy, HFrEF, LBBB, and hypothyroidism.  She previously suffered a non-STEMI in December 2015 with catheterization at that time revealing a 99% lesion in the mid LAD, which required placement of a drug-eluting stent.  Echocardiogram at that time showed an EF of 40%.  In January 2019, she underwent stress testing in the setting of abnormal ECG.  This showed no evidence of ischemia, and EF was measured at 57%.  In the setting of dyspnea, follow-up echocardiogram in September 2020 showed an EF of 45 to 50% with moderate pulmonary hypertension and PASP of 46 mmHg.    Kendra Prince last seen via video visit in January of this year, at which time she was doing well.  She has continued to do well w/o chest pain or dyspnea.  She does have fairly diffuse arthritis and with that chronic joint pain.  She often notes lower extremity swelling at the end of the day but believes this is primarily related to sitting or standing for much of the day.  She is not particularly careful with her salt intake.  She rarely experiences palpitations but sometimes notes elevations in heart rate, specifically tied to  either being in pain, or being tired at the end of the day.  At a max, heart rates might rise into the 90s but this is not associated with palpitations.  She denies PND, orthopnea, dizziness, syncope, or early satiety.  Home Medications    Current Outpatient Medications  Medication Sig Dispense Refill   albuterol (PROVENTIL HFA;VENTOLIN HFA) 108 (90 BASE) MCG/ACT inhaler Inhale 2 puffs into the lungs every 6 (six) hours as needed for wheezing or shortness of breath. 1 Inhaler 0   aspirin EC 81 MG tablet Take 1 tablet (81 mg total) by mouth daily. 90 tablet 3   b complex vitamins capsule Take 1 capsule by  mouth daily.     carvedilol (COREG) 3.125 MG tablet Take 1 tablet (3.125 mg total) by mouth 2 (two) times daily with a meal. 180 tablet 3   COD LIVER OIL/VITAMINS A & D CAPS Take by mouth daily.     famotidine (PEPCID) 10 MG tablet Take 10 mg by mouth daily as needed for heartburn or indigestion.     furosemide (LASIX) 20 MG tablet Take 1 tablet (20 mg total) by mouth daily. 90 tablet 3   levothyroxine (SYNTHROID, LEVOTHROID) 100 MCG tablet Take 100 mcg by mouth daily.     lisinopril (ZESTRIL) 40 MG tablet Take 1 tablet (40 mg total) by mouth daily. 90 tablet 3   MAGNESIUM CITRATE PO Take 500 mg by mouth daily.     nitroGLYCERIN (NITROSTAT) 0.4 MG SL tablet Place 0.4 mg under the tongue every 5 (five) minutes as needed for chest pain.     No current facility-administered medications for this visit.     Review of Systems    Rare palpitations and slight rise in heart rate associated with pain or feeling tired.  She does note ankle edema at the end of the day.  She denies chest pain, dyspnea, PND, orthopnea, dizziness, syncope, or early satiety..  All other systems reviewed and are otherwise negative except as noted above.  Physical Exam    VS:  BP 140/80 (BP Location: Left Arm, Patient Position: Sitting, Cuff Size: Normal)   Pulse 64   Ht 5' (1.524 m)   Wt 140 lb (63.5 kg)   SpO2 95%   BMI 27.34 kg/m  , BMI Body mass index is 27.34 kg/m.     GEN: Well nourished, well developed, in no acute distress. HEENT: normal. Neck: Supple, no JVD, carotid bruits, or masses. Cardiac: RRR, no murmurs, rubs, or gallops. No clubbing, cyanosis, edema.  Radials/DP/PT 2+ and equal bilaterally.  Respiratory:  Respirations regular and unlabored, clear to auscultation bilaterally. GI: Soft, nontender, nondistended, BS + x 4. MS: no deformity or atrophy. Skin: warm and dry, no rash. Neuro:  Strength and sensation are intact. Psych: Normal affect.  Accessory Clinical Findings    ECG personally  reviewed by me today -regular sinus rhythm, 64, left bundle branch block - no acute changes.  Lab Results  Component Value Date   WBC 11.4 (H) 02/19/2016   HGB 14.2 02/19/2016   HCT 40.1 02/19/2016   MCV 89.7 02/19/2016   PLT 264 02/19/2016   Lab Results  Component Value Date   CREATININE 0.64 08/24/2019   BUN 16 08/24/2019   NA 133 (L) 08/24/2019   K 4.6 08/24/2019   CL 97 (L) 08/24/2019   CO2 28 08/24/2019   Lab Results  Component Value Date   ALT 12 09/20/2015   AST 21 09/20/2015   ALKPHOS  64 09/20/2015   BILITOT 0.6 09/20/2015   Lab Results  Component Value Date   CHOL 200 (H) 09/20/2015   HDL 44 09/20/2015   LDLCALC 125 (H) 09/20/2015   TRIG 156 (H) 09/20/2015   CHOLHDL 4.5 (H) 09/20/2015    Assessment & Plan    1.  Coronary artery disease: Status post non-STEMI and LAD stenting in December 2015.  Nonischemic Myoview in January 2019.  She has been doing well without chest pain or dyspnea and remains active.  She remains on aspirin, beta-blocker, and ACE inhibitor therapy.  She is not on a statin or Zetia secondary to prior intolerances.  Lipids are followed by primary care.  2.  Chronic HFrEF (mid-range)/ischemic cardiomyopathy: EF 45 to 50% by echo in 2021.  She is euvolemic on examination.  Blood pressure is up today however, she does check this at home and she typically runs in the low 100s to 1 teens.  She remains on beta-blocker, ACE inhibitor, and low-dose Lasix.  We discussed the importance of daily weights, sodium restriction, medication compliance, and symptom reporting and she verbalizes understanding.   3.  Essential hypertension: Blood pressure elevated today with prior history of whitecoat syndrome.  Pressure today is elevated at 140/80 and 140/72 on repeat.  Blood pressure trends at home are in the low 100s to 1 teens.  She will continue to follow blood pressure at home.  Continue current doses of beta-blocker and ACE inhibitor.  4.  Hyperlipidemia:  Unfortunately, she is statin and Zetia intolerant.  She prefers to continue with dietary management.  5.  Disposition: Follow-up in clinic in 6 months or sooner if necessary.   Murray Hodgkins, NP 10/18/2020, 9:54 AM

## 2020-10-18 NOTE — Patient Instructions (Signed)
Medication Instructions:  No changes at this time.   *If you need a refill on your cardiac medications before your next appointment, please call your pharmacy*   Lab Work: None  If you have labs (blood work) drawn today and your tests are completely normal, you will receive your results only by: [place] (if you have MyChart) OR A paper copy in the mail If you have any lab test that is abnormal or we need to change your treatment, we will call you to review the results.   Testing/Procedures: None    Follow-Up: At [facility], you and your health needs are our priority.  As part of our continuing mission to provide you with exceptional heart care, we have created designated Provider Care Teams.  These Care Teams include your primary Cardiologist (physician) and Advanced Practice Providers (APPs -  Physician Assistants and Nurse Practitioners) who all work together to provide you with the care you need, when you need it.   Your next appointment:   6 month(s)  The format for your next appointment:   In Person  Provider:   Kathlyn [place], MD or Murray Hodgkins, NP

## 2020-10-29 ENCOUNTER — Other Ambulatory Visit: Payer: Self-pay

## 2020-10-29 ENCOUNTER — Ambulatory Visit (INDEPENDENT_AMBULATORY_CARE_PROVIDER_SITE_OTHER): Payer: Medicare Other | Admitting: Gastroenterology

## 2020-10-29 ENCOUNTER — Encounter: Payer: Self-pay | Admitting: Gastroenterology

## 2020-10-29 VITALS — BP 170/71 | HR 81 | Temp 98.2°F | Ht 60.0 in | Wt 142.0 lb

## 2020-10-29 DIAGNOSIS — K581 Irritable bowel syndrome with constipation: Secondary | ICD-10-CM

## 2020-10-29 DIAGNOSIS — I255 Ischemic cardiomyopathy: Secondary | ICD-10-CM

## 2020-10-29 NOTE — Progress Notes (Signed)
Jonathon Bellows MD, MRCP(U.K) [address]  [place]  [place], [place] [address]  Main: [phone]  Fax: [phone]   Gastroenterology Consultation  Referring Provider:     Walker Kehr, * Primary Care Physician:  Barbaraann Boys, MD Primary Gastroenterologist:  Dr. Jonathon Bellows  Reason for Consultation:     Diarrhea and abdominal pain        HPI:   Kendra Prince is a 87 y.o. y/o female referred for consultation & management  by Dr. Barbaraann Boys, MD.    She has been referred for diarrhea and right-sided abdominal pain. Upper GI series in 2019 showed narrowing of the distal esophagus may be related to hiatal hernia fixation cannot be ruled out.  Has been seen by [facility] gastroenterology and Dr. Alice Reichert last in July 2019 for epigastric pain.  At that time had some bloating.  Was commenced on Zantac had a stent in place so procedures were held off at that point of time.  10/05/2019 CMP is normal, HbA1c is 6.8, TSH is normal.  CBC in January 2021 showed hemoglobin 12.4 g.   She is accompanied today with her daughter.  She says that she absolutely has no diarrhea at this point of time.  Rather has a bit of constipation.  Ongoing for many years.  She says that she has a bowel movement but is very hard and associated with a lot of bloating gas and abdominal distention particularly on the right side of her abdomen which begins after meals.  Relieved after passage of hard stools.  She uses probiotics.  Denies any other weight loss or change in bowel habits.  No other complaints.  Not taken any laxatives. Past Medical History:  Diagnosis Date   Chronic systolic heart failure ([place])    a. 02/2014 LV gram: EF 40%, mod antlat HK, sev apical HK; b. 03/2017 MV: EF 57%; c. 11/2018 Echo: EF 45-50%, impaired relaxation. Nl RV size/fxn. Mildly dil LA. Mod elev PASP. Asc Ao 52m.   Coronary artery disease    a. 02/2014 NSTEMI/PCI: LM nl, LAD 935m2.5x33 Xience Alpine DES), LCX 50d,  OM1/2 nl, RCA nl, RPDA nl, RPL1 nl, EF 40%; b. 03/2017 MV: EF 57%. Fixed apical and apical septal defect - MI vs attenuation. No ischemia.   Diabetes mellitus without complication (HCC)    Hyperlipidemia    a. Statin intolerant; b. Could not afford Zetia.   Hypertension    Hypothyroidism    Ischemic cardiomyopathy    a. 02/2014 LV gram: EF 40%, mod antlat HK, sev apical HK.   LBBB (left bundle branch block)    [place]    Past Surgical History:  Procedure Laterality Date   CARDIAC CATHETERIZATION  03/16/2014   x1 stent   CARPAL TUNNEL RELEASE     MOHS SURGERY     SHOULDER SURGERY     TONSILLECTOMY     TOTAL ABDOMINAL HYSTERECTOMY     TOTAL KNEE ARTHROPLASTY Left     Prior to Admission medications   Medication Sig Start Date End Date Taking? Authorizing Provider  albuterol (PROVENTIL HFA;VENTOLIN HFA) 108 (90 BASE) MCG/ACT inhaler Inhale 2 puffs into the lungs every 6 (six) hours as needed for wheezing or shortness of breath. 05/07/14   ArWellington HampshireMD  aspirin EC 81 MG tablet Take 1 tablet (81 mg total) by mouth daily. 02/11/16   ArWellington HampshireMD  b complex vitamins capsule Take 1  capsule by mouth daily.    [provider]  carvedilol (COREG) 3.125 MG tablet Take 1 tablet (3.125 mg total) by mouth 2 (two) times daily with a meal. 04/05/20   Wellington Hampshire, MD  COD LIVER OIL/VITAMINS A & D CAPS Take by mouth daily.    [provider]  famotidine (PEPCID) 10 MG tablet Take 10 mg by mouth daily as needed for heartburn or indigestion.    [provider]  furosemide (LASIX) 20 MG tablet Take 1 tablet (20 mg total) by mouth daily. 04/05/20   Wellington Hampshire, MD  levothyroxine (SYNTHROID, LEVOTHROID) 100 MCG tablet Take 100 mcg by mouth daily.    [provider]  lisinopril (ZESTRIL) 40 MG tablet Take 1 tablet (40 mg total) by mouth daily. 04/05/20   Wellington Hampshire, MD  MAGNESIUM CITRATE PO Take 500 mg by mouth  daily.    [provider]  nitroGLYCERIN (NITROSTAT) 0.4 MG SL tablet Place 0.4 mg under the tongue every 5 (five) minutes as needed for chest pain.    [provider]    Family History  Problem Relation Age of Onset   Heart attack Father 1   Heart attack Brother 58     Social History   Tobacco Use   Smoking status: Never   Smokeless tobacco: Never  Vaping Use   Vaping Use: Never used  Substance Use Topics   Alcohol use: No   Drug use: No    Allergies as of 10/29/2020 - Review Complete 10/18/2020  Allergen Reaction Noted   Amlodipine Other (See Comments) and Palpitations 02/25/2016   Azithromycin Other (See Comments) 05/27/2015   Norvasc [amlodipine besylate]  08/06/2016   Statins Other (See Comments) 05/27/2015   Sulfa antibiotics Itching 03/29/2014   Tape  08/08/2015   Codeine Other (See Comments) and Rash 03/29/2014    Review of Systems:    All systems reviewed and negative except where noted in HPI.   Physical Exam:  There were no vitals taken for this visit. No LMP recorded. Patient has had a hysterectomy. Psych:  Alert and cooperative. Normal mood and affect. General:   Alert,  Well-developed, well-nourished, pleasant and cooperative in NAD Head:  Normocephalic and atraumatic. Eyes:  Sclera clear, no icterus.   Conjunctiva pink. Abdomen:  Normal bowel sounds.  No bruits.  Soft, non-tender and non-distended without masses, hepatosplenomegaly or hernias noted.  No guarding or rebound tenderness.   . Neurologic:  Alert and oriented x3;  grossly normal neurologically. Psych:  Alert and cooperative. Normal mood and affect.  Imaging Studies: No results found.  Assessment and Plan:   Kendra Prince is a 87 y.o. y/o female has been referred for diarrhea and right-sided abdominal pain.  She denies any diarrhea today her main symptoms are right-sided bloating abdominal discomfort with distention relieved after good bowel movement but she does not  have regular bowel movements with constipation.  Has been passing hard stool which is very dry.  I believe that she probably has IBS constipation leading to buildup of maintain and gas production due to usage of probiotics.  Plan 1.  Stop all all artificial sugars and sweeteners, stop probiotics 2.  Commence on 1 cap of MiraLAX daily.  Titrate to 1-2 soft bowel movements per day. 3.  If no better at next visit we will consider other options.  Including colonoscopy  Follow up in as needed  Dr Jonathon Bellows MD,MRCP(U.K)

## 2020-10-29 NOTE — Patient Instructions (Signed)
Low-FODMAP Eating Plan °FODMAP stands for fermentable oligosaccharides, disaccharides, monosaccharides, and polyols. These are sugars that are hard for some people to digest. A low-FODMAP eating plan may help some people who have irritable bowel syndrome (IBS) and certain other bowel (intestinal) diseases to manage their symptoms. °This meal plan can be complicated to follow. Work with a diet and nutrition specialist (dietitian) to make a low-FODMAP eating plan that is right for you. A dietitian can help make sure that you get enough nutrition from this diet. °What are tips for following this plan? °Reading food labels °Check labels for hidden FODMAPs such as: °High-fructose syrup. °Honey. °Agave. °Natural fruit flavors. °Onion or garlic powder. °Choose low-FODMAP foods that contain 3-4 grams of fiber per serving. °Check food labels for serving sizes. Eat only one serving at a time to make sure FODMAP levels stay low. °Shopping °Shop with a list of foods that are recommended on this diet and make a meal plan. °Meal planning °Follow a low-FODMAP eating plan for up to 6 weeks, or as told by your health care provider or dietitian. °To follow the eating plan: °Eliminate high-FODMAP foods from your diet completely. Choose only low-FODMAP foods to eat. You will do this for 2-6 weeks. °Gradually reintroduce high-FODMAP foods into your diet one at a time. Most people should wait a few days before introducing the next new high-FODMAP food into their meal plan. Your dietitian can recommend how quickly you may reintroduce foods. °Keep a daily record of what and how much you eat and drink. Make note of any symptoms that you have after eating. °Review your daily record with a dietitian regularly to identify which foods you can eat and which foods you should avoid. °General tips °Drink enough fluid each day to keep your urine pale yellow. °Avoid processed foods. These often have added sugar and may be high in FODMAPs. °Avoid most  dairy products, whole grains, and sweeteners. °Work with a dietitian to make sure you get enough fiber in your diet. °Avoid high FODMAP foods at meals to manage symptoms. °Recommended foods °Fruits °Bananas, oranges, tangerines, lemons, limes, blueberries, raspberries, strawberries, grapes, cantaloupe, honeydew melon, kiwi, papaya, passion fruit, and pineapple. Limited amounts of dried cranberries, banana chips, and shredded coconut. °Vegetables °Eggplant, zucchini, cucumber, peppers, green beans, bean sprouts, lettuce, arugula, kale, Swiss chard, spinach, collard greens, bok choy, summer squash, potato, and tomato. Limited amounts of corn, carrot, and sweet potato. Green parts of scallions. °Grains °Gluten-free grains, such as rice, oats, buckwheat, quinoa, corn, polenta, and millet. Gluten-free pasta, bread, or cereal. Rice noodles. Corn tortillas. °Meats and other proteins °Unseasoned beef, pork, poultry, or fish. Eggs. Bacon. Tofu (firm) and tempeh. Limited amounts of nuts and seeds, such as almonds, walnuts, brazil nuts, pecans, peanuts, nut butters, pumpkin seeds, chia seeds, and sunflower seeds. °Dairy °Lactose-free milk, yogurt, and kefir. Lactose-free cottage cheese and ice cream. Non-dairy milks, such as almond, coconut, hemp, and rice milk. Non-dairy yogurt. Limited amounts of goat cheese, brie, mozzarella, parmesan, swiss, and other hard cheeses. °Fats and oils °Butter-free spreads. Vegetable oils, such as olive, canola, and sunflower oil. °Seasoning and other foods °Artificial sweeteners with names that do not end in "ol," such as aspartame, saccharine, and stevia. Maple syrup, white table sugar, raw sugar, brown sugar, and molasses. Mayonnaise, soy sauce, and tamari. Fresh basil, coriander, parsley, rosemary, and thyme. °Beverages °Water and mineral water. Sugar-sweetened soft drinks. Small amounts of orange juice or cranberry juice. Black and green tea. Most dry wines. Coffee. °  The items listed above  may not be a complete list of foods and beverages you can eat. Contact a dietitian for more information. °Foods to avoid °Fruits °Fresh, dried, and juiced forms of apple, pear, watermelon, peach, plum, cherries, apricots, blackberries, boysenberries, figs, nectarines, and mango. Avocado. °Vegetables °Chicory root, artichoke, asparagus, cabbage, snow peas, Brussels sprouts, broccoli, sugar snap peas, mushrooms, celery, and cauliflower. Onions, garlic, leeks, and the white part of scallions. °Grains °Wheat, including kamut, durum, and semolina. Barley and bulgur. Couscous. Wheat-based cereals. Wheat noodles, bread, crackers, and pastries. °Meats and other proteins °Fried or fatty meat. Sausage. Cashews and pistachios. Soybeans, baked beans, black beans, chickpeas, kidney beans, fava beans, navy beans, lentils, black-eyed peas, and split peas. °Dairy °Milk, yogurt, ice cream, and soft cheese. Cream and sour cream. Milk-based sauces. Custard. Buttermilk. Soy milk. °Seasoning and other foods °Any sugar-free gum or candy. Foods that contain artificial sweeteners such as sorbitol, mannitol, isomalt, or xylitol. Foods that contain honey, high-fructose corn syrup, or agave. Bouillon, vegetable stock, beef stock, and chicken stock. Garlic and onion powder. Condiments made with onion, such as hummus, chutney, pickles, relish, salad dressing, and salsa. Tomato paste. °Beverages °Chicory-based drinks. Coffee substitutes. Chamomile tea. Fennel tea. Sweet or fortified wines such as port or sherry. Diet soft drinks made with isomalt, mannitol, maltitol, sorbitol, or xylitol. Apple, pear, and mango juice. Juices with high-fructose corn syrup. °The items listed above may not be a complete list of foods and beverages you should avoid. Contact a dietitian for more information. °Summary °FODMAP stands for fermentable oligosaccharides, disaccharides, monosaccharides, and polyols. These are sugars that are hard for some people to  digest. °A low-FODMAP eating plan is a short-term diet that helps to ease symptoms of certain bowel diseases. °The eating plan usually lasts up to 6 weeks. After that, high-FODMAP foods are reintroduced gradually and one at a time. This can help you find out which foods may be causing symptoms. °A low-FODMAP eating plan can be complicated. It is best to work with a dietitian who has experience with this type of plan. °This information is not intended to replace advice given to you by your health care provider. Make sure you discuss any questions you have with your health care provider. °Document Revised: 08/03/2019 Document Reviewed: 08/03/2019 °Elsevier Patient Education © 2022 [employer] ° °

## 2021-06-01 ENCOUNTER — Other Ambulatory Visit: Payer: Self-pay | Admitting: Cardiovascular Disease

## 2021-06-02 NOTE — Telephone Encounter (Signed)
Attempted to schedule.  LMOV to call office.  ° °

## 2021-06-02 NOTE — Telephone Encounter (Signed)
Please schedule overdue 6 month F/U appointment. Thank you! °

## 2021-06-05 ENCOUNTER — Other Ambulatory Visit: Payer: Self-pay

## 2021-06-05 ENCOUNTER — Encounter: Payer: Self-pay | Admitting: Cardiovascular Disease

## 2021-06-05 MED ORDER — CARVEDILOL 3.125 MG PO TABS: 3.1250 mg | ORAL_TABLET | Freq: Two times a day (BID) | ORAL | 2 refills | Status: DC

## 2021-06-06 ENCOUNTER — Other Ambulatory Visit: Payer: Self-pay | Admitting: Cardiovascular Disease

## 2021-07-25 ENCOUNTER — Ambulatory Visit (INDEPENDENT_AMBULATORY_CARE_PROVIDER_SITE_OTHER): Payer: Medicare Other | Admitting: Cardiovascular Disease

## 2021-07-25 ENCOUNTER — Encounter: Payer: Self-pay | Admitting: Cardiovascular Disease

## 2021-07-25 VITALS — BP 140/60 | HR 62 | Ht 59.0 in | Wt 136.1 lb

## 2021-07-25 DIAGNOSIS — I2510 Atherosclerotic heart disease of native coronary artery without angina pectoris: Secondary | ICD-10-CM

## 2021-07-25 DIAGNOSIS — E785 Hyperlipidemia, unspecified: Secondary | ICD-10-CM

## 2021-07-25 DIAGNOSIS — I10 Essential (primary) hypertension: Secondary | ICD-10-CM | POA: Diagnosis not present

## 2021-07-25 DIAGNOSIS — I5022 Chronic systolic (congestive) heart failure: Secondary | ICD-10-CM | POA: Diagnosis not present

## 2021-07-25 NOTE — Progress Notes (Signed)
?  ?Cardiology Office Note ? ? ?Date:  07/25/2021  ? ?ID:  Kendra Prince, DOB 02-May-1931, MRN [id number] ? ?PCP:  Rolanda Jay, MD  ?Cardiologist:   Kathlyn Sacramento, MD  ? ?Chief Complaint  ?Patient presents with  ? Other  ?  OD 6 month f/u no complaints today. Meds reviewed verbally with pt.  ? ? ?  ?History of Present Illness: ?Kendra Prince is a 88 y.o. female who presents for a follow-up visit regarding coronary artery disease and heart failure. ?She has known history of hypertension and type 2 diabetes. She has history of palpitations controlled with atenolol. She presented in December 2015 with non-ST elevation myocardial infarction with anterior ischemic changes on EKG. She underwent cardiac catheterization which showed 99% stenosis in the mid LAD with ejection fraction of 40%. PCI and drug-eluting stent placement was performed to the LAD without complications.  ?She has known history of hyperlipidemia with known intolerance to all statins.   ?Most recent ischemic work-up was in January 2019 with a [facility] which showed no evidence of ischemia with normal ejection fraction at 57%. ? She had an echocardiogram done in September 2020 which showed an EF of 45 to 50% with moderate pulmonary hypertension with systolic pulmonary pressure of 46 mmHg. ? ?She has been doing well overall with no chest pain.  She reports stable exertional dyspnea and lower extremity edema.  She uses furosemide only as needed when she has increased swelling in her legs. ? ? ?Past Medical History:  ?Diagnosis Date  ? Chronic systolic heart failure ([place])   ? a. 02/2014 LV gram: EF 40%, mod antlat HK, sev apical HK; b. 03/2017 MV: EF 57%; c. 11/2018 Echo: EF 45-50%, impaired relaxation. Nl RV size/fxn. Mildly dil LA. Mod elev PASP. Asc Ao 19m.  ? Coronary artery disease   ? a. 02/2014 NSTEMI/PCI: LM nl, LAD [id number] Xience [place] DES), LCX 50d, OM1/2 nl, RCA nl, RPDA nl, RPL1 nl, EF 40%; b. 03/2017 MV: EF 57%.  Fixed apical and apical septal defect - MI vs attenuation. No ischemia.  ? Diabetes mellitus without complication ([place]  ? Hyperlipidemia   ? a. Statin intolerant; b. Could not afford Zetia.  ? Hypertension   ? Hypothyroidism   ? Ischemic cardiomyopathy   ? a. 02/2014 LV gram: EF 40%, mod antlat HK, sev apical HK.  ? LBBB (left bundle branch block)   ? [place]  ? ? ?Past Surgical History:  ?Procedure Laterality Date  ? CARDIAC CATHETERIZATION  03/16/2014  ? x1 stent  ? CARPAL TUNNEL RELEASE    ? MOHS SURGERY    ? SHOULDER SURGERY    ? TONSILLECTOMY    ? TOTAL ABDOMINAL HYSTERECTOMY    ? TOTAL KNEE ARTHROPLASTY Left   ? ? ? ?Current Outpatient Medications  ?Medication Sig Dispense Refill  ? albuterol (PROVENTIL HFA;VENTOLIN HFA) 108 (90 BASE) MCG/ACT inhaler Inhale 2 puffs into the lungs every 6 (six) hours as needed for wheezing or shortness of breath. 1 Inhaler 0  ? aspirin EC 81 MG tablet Take 1 tablet (81 mg total) by mouth daily. 90 tablet 3  ? b complex vitamins capsule Take 1 capsule by mouth daily.    ? carvedilol (COREG) 3.125 MG tablet TAKE 1 TABLET (3.125 MG TOTAL) BY MOUTH 2 (TWO) TIMES DAILY WITH A MEAL. 60 tablet 0  ? COD LIVER OIL/VITAMINS A & D CAPS Take by mouth daily.    ?  famotidine (PEPCID) 10 MG tablet Take 10 mg by mouth daily as needed for heartburn or indigestion.    ? furosemide (LASIX) 20 MG tablet Take 1 tablet (20 mg total) by mouth daily. 90 tablet 3  ? gabapentin (NEURONTIN) 100 MG capsule Take 200 mg by mouth at bedtime.    ? levothyroxine (SYNTHROID, LEVOTHROID) 100 MCG tablet Take 100 mcg by mouth daily.    ? lisinopril (ZESTRIL) 10 MG tablet daily.    ? MAGNESIUM CITRATE PO Take 500 mg by mouth daily.    ? melatonin 5 MG TABS Take 0.5 mg by mouth at bedtime.    ? nitroGLYCERIN (NITROSTAT) 0.4 MG SL tablet Place 0.4 mg under the tongue every 5 (five) minutes as needed for chest pain.    ? ?No current facility-administered medications for this visit.   ? ? ?Allergies:   Amlodipine, Azithromycin, Norvasc [amlodipine besylate], Statins, Sulfa antibiotics, Tape, and Codeine  ? ? ?Social History:  The patient  reports that she has never smoked. She has never used smokeless tobacco. She reports that she does not drink alcohol and does not use drugs.  ? ?Family History:  The patient's family history includes Heart attack (age of onset: 29) in her brother; Heart attack (age of onset: 39) in her father.  ? ? ?ROS:  Please see the history of present illness.   Otherwise, review of systems are positive for none.   All other systems are reviewed and negative.  ? ? ?PHYSICAL EXAM: ?VS:  BP 140/60 (BP Location: Left Arm, Patient Position: Sitting, Cuff Size: Normal)   Pulse 62   Ht '4\' 11"'$  (1.499 m)   Wt 136 lb 2 oz (61.7 kg)   SpO2 96%   BMI 27.49 kg/m?  , BMI Body mass index is 27.49 kg/m?. ?GEN: Well nourished, well developed, in no acute distress  ?HEENT: normal  ?Neck: no JVD, carotid bruits, or masses ?Cardiac: RRR; no  rubs, or gallops, trace bilateral leg edema.  1 out of 6 systolic murmur in the aortic area ?Respiratory:  clear to auscultation bilaterally, normal work of breathing ?GI: soft, nontender, nondistended, + BS ?MS: no deformity or atrophy  ?Skin: warm and dry, no rash ?Neuro:  Strength and sensation are intact ?Psych: euthymic mood, full affect ?Dorsalis pedis is normal bilaterally ? ?EKG:  EKG is ordered today. ?The ekg ordered today demonstrates normal sinus rhythm with left bundle branch block  ? ? ?Recent Labs: ?No results found for requested labs within last 8760 hours.  ? ? ?Lipid Panel ?   ?Component Value Date/Time  ? CHOL 200 (H) 09/20/2015 5284  ? CHOL 145 03/16/2014 0538  ? TRIG 156 (H) 09/20/2015 1324  ? TRIG 173 03/16/2014 0538  ? HDL 44 09/20/2015 0928  ? HDL 40 03/16/2014 0538  ? CHOLHDL 4.5 (H) 09/20/2015 4010  ? VLDL 35 03/16/2014 0538  ? LDLCALC 125 (H) 09/20/2015 2725  ? [place] 70 03/16/2014 0538  ? ?  ? ?Wt Readings from Last 3  Encounters:  ?07/25/21 136 lb 2 oz (61.7 kg)  ?10/29/20 142 lb (64.4 kg)  ?10/18/20 140 lb (63.5 kg)  ?  ? ? ? ? ?ASSESSMENT AND PLAN: ? ?1.  Coronary artery disease involving native coronary arteries without angina:  She is doing well overall with no anginal symptoms. Continue aspirin indefinitely.  ? ?2. Hyperlipidemia:  Unfortunately, she is intolerant to all statins.  Also she did not tolerate Zetia due to GI symptoms.  She seems  to have allergies to multiple medications.  Continue with lifestyle changes. ? ?3. Essential hypertension: Blood pressure is reasonably controlled on lisinopril and carvedilol.  She reports previous dry cough with higher dose of lisinopril but improved symptoms with the lower dose. ? ?4. Chronic systolic heart failure: Most recent ejection fraction was 45 to 50%.  She uses furosemide as needed.  Continue small dose carvedilol and lisinopril.  I reviewed her recent labs done in March which showed normal renal function and electrolytes. ? ? ? ?Disposition:   FU with me in 6 months ? ?Signed, ? ?Kathlyn Sacramento, MD  ?07/25/2021 9:37 AM    ?[place] ?

## 2021-07-25 NOTE — Patient Instructions (Signed)

## 2021-08-29 ENCOUNTER — Other Ambulatory Visit: Payer: Self-pay | Admitting: Cardiovascular Disease

## 2021-08-30 ENCOUNTER — Other Ambulatory Visit: Payer: Self-pay | Admitting: Cardiovascular Disease

## 2021-10-02 ENCOUNTER — Other Ambulatory Visit: Payer: Self-pay | Admitting: Cardiovascular Disease

## 2021-11-04 ENCOUNTER — Encounter: Payer: Self-pay | Admitting: Cardiovascular Disease

## 2021-11-04 MED ORDER — LISINOPRIL 10 MG PO TABS: 10.0000 mg | ORAL_TABLET | Freq: Every day | ORAL | 1 refills | Status: DC

## 2021-11-13 ENCOUNTER — Ambulatory Visit (INDEPENDENT_AMBULATORY_CARE_PROVIDER_SITE_OTHER)
Admit: 2021-11-13 | Discharge: 2021-11-13 | Disposition: A | Discharge status: Home / Self Care | Payer: Medicare Other | Attending: Family Medicine | Admitting: Family Medicine

## 2021-11-13 ENCOUNTER — Ambulatory Visit
Admission: RE | Admit: 2021-11-13 | Discharge: 2021-11-13 | Disposition: A | Discharge status: Home / Self Care | Payer: Medicare Other | Source: Ambulatory Visit | Attending: Family Medicine | Admitting: Family Medicine

## 2021-11-13 VITALS — BP 141/81 | HR 85 | Temp 97.7°F | Resp 18

## 2021-11-13 DIAGNOSIS — R1030 Lower abdominal pain, unspecified: Secondary | ICD-10-CM | POA: Diagnosis not present

## 2021-11-13 DIAGNOSIS — K5732 Diverticulitis of large intestine without perforation or abscess without bleeding: Secondary | ICD-10-CM | POA: Insufficient documentation

## 2021-11-13 LAB — CBC WITH DIFFERENTIAL/PLATELET
Abs Immature Granulocytes: 0.04 10*3/uL (ref 0.00–0.07)
Basophils Absolute: 0 10*3/uL (ref 0.0–0.1)
Basophils Relative: 0 %
Eosinophils Absolute: 0 10*3/uL (ref 0.0–0.5)
Eosinophils Relative: 0 %
HCT: 36.4 % (ref 36.0–46.0)
Hemoglobin: 13.1 g/dL (ref 12.0–15.0)
Immature Granulocytes: 0 %
Lymphocytes Relative: 13 %
Lymphs Abs: 1.8 10*3/uL (ref 0.7–4.0)
MCH: 32 pg (ref 26.0–34.0)
MCHC: 36 g/dL (ref 30.0–36.0)
MCV: 88.8 fL (ref 80.0–100.0)
Monocytes Absolute: 1.3 10*3/uL — ABNORMAL HIGH (ref 0.1–1.0)
Monocytes Relative: 9 %
Neutro Abs: 10.8 10*3/uL — ABNORMAL HIGH (ref 1.7–7.7)
Neutrophils Relative %: 78 %
Platelets: 219 10*3/uL (ref 150–400)
RBC: 4.1 MIL/uL (ref 3.87–5.11)
RDW: 12.5 % (ref 11.5–15.5)
WBC: 13.9 10*3/uL — ABNORMAL HIGH (ref 4.0–10.5)
nRBC: 0 % (ref 0.0–0.2)

## 2021-11-13 LAB — URINALYSIS, ROUTINE W REFLEX MICROSCOPIC
Bilirubin Urine: NEGATIVE
Glucose, UA: NEGATIVE mg/dL
Hgb urine dipstick: NEGATIVE
Ketones, ur: NEGATIVE mg/dL
Leukocytes,Ua: NEGATIVE
Nitrite: NEGATIVE
Protein, ur: NEGATIVE mg/dL
Specific Gravity, Urine: 1.02 (ref 1.005–1.030)
pH: 7 (ref 5.0–8.0)

## 2021-11-13 LAB — COMPREHENSIVE METABOLIC PANEL
ALT: 15 U/L (ref 0–44)
AST: 19 U/L (ref 15–41)
Albumin: 4.1 g/dL (ref 3.5–5.0)
Alkaline Phosphatase: 65 U/L (ref 38–126)
Anion gap: 7 (ref 5–15)
BUN: 12 mg/dL (ref 8–23)
CO2: 27 mmol/L (ref 22–32)
Calcium: 9.1 mg/dL (ref 8.9–10.3)
Chloride: 98 mmol/L (ref 98–111)
Creatinine, Ser: 0.65 mg/dL (ref 0.44–1.00)
GFR, Estimated: >60 mL/min (ref >60)
Glucose, Bld: 144 mg/dL — ABNORMAL HIGH (ref 70–99)
Potassium: 4.2 mmol/L (ref 3.5–5.1)
Sodium: 132 mmol/L — ABNORMAL LOW (ref 135–145)
Total Bilirubin: 1.3 mg/dL — ABNORMAL HIGH (ref 0.3–1.2)
Total Protein: 7.2 g/dL (ref 6.5–8.1)

## 2021-11-13 LAB — LIPASE, BLOOD: Lipase: 24 U/L (ref 11–51)

## 2021-11-13 MED ORDER — IOHEXOL 300 MG/ML  SOLN
100.0000 mL | Freq: Once | INTRAMUSCULAR | Status: AC | PRN
Start: 2021-11-13 — End: 2021-11-13
  Administered 2021-11-13: 100 mL via INTRAVENOUS

## 2021-11-13 MED ORDER — LEVOFLOXACIN 750 MG PO TABS: 750.0000 mg | ORAL_TABLET | Freq: Every day | ORAL | 0 refills | Status: AC

## 2021-11-13 MED ORDER — METRONIDAZOLE 500 MG PO TABS: 500.0000 mg | ORAL_TABLET | Freq: Three times a day (TID) | ORAL | 0 refills | Status: AC

## 2021-11-13 NOTE — Discharge Instructions (Addendum)
Your CT scan showed some sigmoid diverticulitis which inflammation of the sigmoid (lower part of her colon). See handout for more information.  As she had an elevated white blood cell count which is a marker infection we will give you antibiotics.  Please take all of the antibiotics.  If you start to develop diarrhea, please seek medical care.  Be sure to follow-up with your gastroenterologist, Dr. Vicente Males.   There were small nodules seen in the lower part of your chest.  Be sure to speak with your primary care doctor about these findings. They may warrant additional imaging in the future.

## 2021-11-13 NOTE — ED Provider Notes (Addendum)
[facility]    CSN: [id number] Arrival date & time: 11/13/21  1048 PCP: Rolanda Jay, MD      History   Chief Complaint Chief Complaint  Kendra Prince presents with   Abdominal Pain    I have had an IBS flare that is still causing me pain. I'm guessing that it's still IBS related. Rule out bladder? - Entered by Kendra Prince    HPI Kendra Prince is a 88 y.o. female.   HPI  Kendra Prince here for abdominal pain. Says "I've had stomach pain since I was a kid".  She went to [place] last weekend for her granddaughters pool party.  Monday she says she felt great and was very relaxed..  Tuesday she started having abdominal cramps. Yesterday, when she started having pain with sitting.  Feels like a catch in her stomach.  She remembers similar pain to this before that resolved with heating pads and ibuprofen but this time it has not.  Says she usually does not eat a lot to that is not new for her.  Recalls that there was a time when Dr. Jimmye Norman told her to take half a tablet of diazepam for spasms and that helped some.  She is unsure if this is the same. She follows   Last week had bloating and couldn't get the gas to move.   No history of hemorrhoids.  Has a known hiatal hernia and was told they " cannot do anything for."  Reports history of IBS.  Takes MiraLAX but usually takes a half a dose because she is prone to have diarrhea.  Has been having pellet-like stools but says when she wipes they are soft.  She does follow with a gastroenterologist.   Denies fever, chills, diarrhea, dysuria, new urinary frequency or urgency.    Fever : no  Chills: no Sore throat: irritated    Cough: no Nasal congestion : no  Appetite: normal  Hydration: normal  Abdominal pain: yes Nausea: no Vomiting: no Dysuria: no  Sleep disturbance: no Back Pain: no Headache: no   Past Medical History:  Diagnosis Date   Chronic systolic heart failure ([place])    a. 02/2014 LV gram: EF 40%, mod  antlat HK, sev apical HK; b. 03/2017 MV: EF 57%; c. 11/2018 Echo: EF 45-50%, impaired relaxation. Nl RV size/fxn. Mildly dil LA. Mod elev PASP. Asc Ao 55m.   Coronary artery disease    a. 02/2014 NSTEMI/PCI: LM nl, LAD [id number] Xience Alpine DES), LCX 50d, OM1/2 nl, RCA nl, RPDA nl, RPL1 nl, EF 40%; b. 03/2017 MV: EF 57%. Fixed apical and apical septal defect - MI vs attenuation. No ischemia.   Diabetes mellitus without complication (HCC)    Hyperlipidemia    a. Statin intolerant; b. Could not afford Zetia.   Hypertension    Hypothyroidism    Ischemic cardiomyopathy    a. 02/2014 LV gram: EF 40%, mod antlat HK, sev apical HK.   LBBB (left bundle branch block)    MI (myocardial infarction) ([facility]/2015    Kendra Prince Active Problem List   Diagnosis Date Noted   Polyneuropathy associated with underlying disease ([facility]/10/2019   Gastroesophageal reflux disease 11/15/2018   Hernia, hiatal 11/15/2018   Eustachian tube disorder, right 05/17/2018   Osteoporosis, post-menopausal 01/03/2018   Statin intolerance 06/26/2016   Melanoma of knee ([facility]/12/2015   Arthritis involving multiple sites 08/29/2014   Bilateral plantar fasciitis 08/29/2014   Benign positional vertigo, unspecified laterality 07/31/2014  History of MI (myocardial infarction) 07/31/2014   Type 2 diabetes mellitus with diabetic neuropathy ([place]) 07/31/2014   Acute non-ST-elevation myocardial infarction ([place]) 06/29/2014   Diabetes mellitus ([place]) 06/29/2014   Myocardial infarction ([place]) 06/29/2014   Pneumonia, organism unspecified([place]) 05/10/2014   Mild intermittent asthma with acute exacerbation 05/08/2014   Primary osteoarthritis of left foot 04/27/2014   Hyperlipidemia    Chronic systolic heart failure ([place])    Coronary artery disease    Acquired hypothyroidism 01/08/2014   Essential hypertension 01/08/2014   Type 2 diabetes mellitus without complication ([place]) 68/34/1960    Past Surgical History:  Procedure  Laterality Date   CARDIAC CATHETERIZATION  03/16/2014   x1 stent   CARPAL TUNNEL RELEASE     MOHS SURGERY     SHOULDER SURGERY     TONSILLECTOMY     TOTAL ABDOMINAL HYSTERECTOMY     TOTAL KNEE ARTHROPLASTY Left     OB History   No obstetric history on file.      Home Medications    Prior to Admission medications   Medication Sig Start Date End Date Taking? Authorizing Provider  levofloxacin (LEVAQUIN) 750 MG tablet Take 1 tablet (750 mg total) by mouth daily for 7 days. 11/13/21 11/20/21 Yes Leahmarie Gasiorowski, DO  metroNIDAZOLE (FLAGYL) 500 MG tablet Take 1 tablet (500 mg total) by mouth 3 (three) times daily for 7 days. 11/13/21 11/20/21 Yes Mishael Haran, DO  albuterol (PROVENTIL HFA;VENTOLIN HFA) 108 (90 BASE) MCG/ACT inhaler Inhale 2 puffs into the lungs every 6 (six) hours as needed for wheezing or shortness of breath. 05/07/14   Wellington Hampshire, MD  aspirin EC 81 MG tablet Take 1 tablet (81 mg total) by mouth daily. 02/11/16   Wellington Hampshire, MD  b complex vitamins capsule Take 1 capsule by mouth daily.    [provider]  carvedilol (COREG) 3.125 MG tablet TAKE (1) TABLET BY MOUTH TWICE DAILY WITH A MEAL 10/02/21   Wellington Hampshire, MD  COD LIVER OIL/VITAMINS A & D CAPS Take by mouth daily.    [provider]  famotidine (PEPCID) 10 MG tablet Take 10 mg by mouth daily as needed for heartburn or indigestion.    [provider]  furosemide (LASIX) 20 MG tablet Take 1 tablet (20 mg total) by mouth daily. 04/05/20   Wellington Hampshire, MD  gabapentin (NEURONTIN) 100 MG capsule Take 200 mg by mouth at bedtime. 06/02/21   [provider]  levothyroxine (SYNTHROID, LEVOTHROID) 100 MCG tablet Take 100 mcg by mouth daily.    [provider]  lisinopril (ZESTRIL) 10 MG tablet Take 1 tablet (10 mg total) by mouth daily. 11/04/21   Wellington Hampshire, MD  MAGNESIUM CITRATE PO Take 500 mg by mouth daily.    [provider]  melatonin 5 MG TABS  Take 0.5 mg by mouth at bedtime.    [provider]  nitroGLYCERIN (NITROSTAT) 0.4 MG SL tablet Place 0.4 mg under the tongue every 5 (five) minutes as needed for chest pain.    [provider]    Family History Family History  Problem Relation Age of Onset   Heart attack Father 45   Heart attack Brother 34    Social History Social History   Tobacco Use   Smoking status: Never   Smokeless tobacco: Never  Vaping Use   Vaping Use: Never used  Substance Use Topics   Alcohol use: No   Drug use: No  Allergies   Amlodipine, Azithromycin, Norvasc [amlodipine besylate], Statins, Sulfa antibiotics, Tape, and Codeine   Review of Systems Review of Systems : :negative unless otherwise stated in HPI.      Physical Exam Triage Vital Signs ED Triage Vitals  Enc Vitals Group     BP 11/13/21 1118 (!) 141/81     Pulse Rate 11/13/21 1118 85     Resp 11/13/21 1118 18     Temp 11/13/21 1118 97.7 F (36.5 C)     Temp Source 11/13/21 1118 Oral     SpO2 11/13/21 1118 98 %     Weight --      Height --      Head Circumference --      Peak Flow --      Pain Score 11/13/21 1117 3     Pain Loc --      Pain Edu? --      Excl. in [place]? --    No data found.  Updated Vital Signs BP (!) 141/81 (BP Location: Right Arm)   Pulse 85   Temp 97.7 F (36.5 C) (Oral)   Resp 18   SpO2 98%   Visual Acuity Right Eye Distance:   Left Eye Distance:   Bilateral Distance:    Right Eye Near:   Left Eye Near:    Bilateral Near:     Physical Exam  GEN: pleasant well appearing elderly female, in no acute distress HENT: Wearing glasses CV: regular rate and rhythm RESP: no increased work of breathing, clear to ascultation bilaterally ABD: Bowel sounds present. Soft, lower abdominal tenderness, no rebound, no guarding, non-distended, no palpable masses, no CVA tenderness SKIN: warm, dry NEURO: alert, moves all extremities appropriately PSYCH: Normal affect, appropriate  speech and behavior   UC Treatments / Results  Labs (all labs ordered are listed, but only abnormal results are displayed) Labs Reviewed  CBC WITH DIFFERENTIAL/PLATELET - Abnormal; Notable for the following components:      Result Value   WBC 13.9 (*)    Neutro Abs 10.8 (*)    Monocytes Absolute 1.3 (*)    All other components within normal limits  COMPREHENSIVE METABOLIC PANEL - Abnormal; Notable for the following components:   Sodium 132 (*)    Glucose, Bld 144 (*)    Total Bilirubin 1.3 (*)    All other components within normal limits  LIPASE, BLOOD  URINALYSIS, ROUTINE W REFLEX MICROSCOPIC    EKG   Radiology CT ABDOMEN PELVIS W CONTRAST  Result Date: 11/13/2021 CLINICAL DATA:  Lower abdominal pain EXAM: CT ABDOMEN AND PELVIS WITH CONTRAST TECHNIQUE: Multidetector CT imaging of the abdomen and pelvis was performed using the standard protocol following bolus administration of intravenous contrast. RADIATION DOSE REDUCTION: This exam was performed according to the departmental dose-optimization program which includes automated exposure control, adjustment of the mA and/or kV according to Kendra Prince size and/or use of iterative reconstruction technique. CONTRAST:  162m OMNIPAQUE IOHEXOL 300 MG/ML  SOLN COMPARISON:  None Available. FINDINGS: Lower chest: Small bilateral solid pulmonary nodules, largest is a left lower lobe pulmonary nodule measuring 3 mm on series 6, image 4. Large hiatal hernia with intrathoracic stomach. Hepatobiliary: No focal liver abnormality is seen. Status post cholecystectomy. No biliary dilatation. Pancreas: Unremarkable. No pancreatic ductal dilatation or surrounding inflammatory changes. Spleen: Normal in size without focal abnormality. Adrenals/Urinary Tract: Adrenal glands are unremarkable. Kidneys are normal, without renal calculi, focal lesion, or hydronephrosis. Bladder is unremarkable. Stomach/Bowel: Wall thickening and inflammatory  change of the sigmoid  colon with associated inflamed diverticula. Contrast is seen throughout the small bowel and cecum. Possible irregular wall thickening of the cecum seen on series 2, image 38. No evidence of obstruction. Vascular/Lymphatic: Aortic atherosclerosis. No enlarged abdominal or pelvic lymph nodes. Reproductive: Prior hysterectomy no adnexal masses. Other: Small fat containing bilateral inguinal hernias. Trace free fluid in the pelvis, likely reactive. Musculoskeletal: Levocurvature of the lumbar spine with moderate multilevel degenerative disc disease. No aggressive appearing osseous lesions. IMPRESSION: 1. Findings compatible with acute uncomplicated sigmoid diverticulitis. 2. Possible irregular wall thickening of the cecum, finding could also related to partial contrast opacification and decompression. Recommend colonoscopy to exclude neoplasm after resolution of acute symptoms. 3. Large hiatal hernia with intrathoracic stomach. 4.  Aortic Atherosclerosis (ICD10-I70.0). 5. Small bilateral solid pulmonary nodules, largest measures 3 mm. No follow-up needed if Kendra Prince is low-risk (and has no known or suspected primary neoplasm). Non-contrast chest CT can be considered in 12 months if Kendra Prince is high-risk. This recommendation follows the consensus statement: Guidelines for Management of Incidental Pulmonary Nodules Detected on CT Images: From the Fleischner Society 2017; Radiology 2017; 284:228-243. These results will be called to the ordering clinician or representative by the Radiologist Assistant, and communication documented in the PACS or [employer]. Electronically Signed   By: Yetta Glassman M.D.   On: 11/13/2021 14:17    Procedures Procedures (including critical care time)  Medications Ordered in UC Medications - No data to display  Initial Impression / Assessment and Plan / UC Course  I have reviewed the triage vital signs and the nursing notes.  Pertinent labs & imaging results that were  available during my care of the Kendra Prince were reviewed by me and considered in my medical decision making (see chart for details).      Kendra Prince is a 88 year old female who presents with persistent lower abdominal pain for the past 3 days.  She has history of IBS and hiatal hernia.  She follows with Dr Vicente Males at [facility] and last note from August 2022 reviewed.  Per Dr. Georgeann Oppenheim note they would consider a colonoscopy, if her constipation was no better.  She has also been seen by Dr. Alice Reichert, GI, in the [facility] for her hiatal hernia.  She has some mild leukocytosis, WBC 13.9 today.  She is afebrile and not tachycardic or tachypneic.  Lipase was normal.  She has some chronic hyponatremia which is at her baseline.  Urinalysis was negative for acute cystitis and there was no hematuria to suggest a kidney stone.  Obtained a CT abdomen pelvis which showed acute sigmoid diverticulitis.  Given Kendra Prince's age and leukocytosis we will treat her with antibiotics (Levaquin and metronidazole).  Strict ED precautions provided.    Additionally, her CT did show a possible irregular wall thickening of the cecum which recommended a colonoscopy after resolution of her symptoms.  A large hiatal hernia was noted as previously seen.    She has some bilateral solid pulmonary nodules the largest measuring 3 mm.  She has no known cancer history and is not a current or former smoker.   Kendra Prince notified of these and additional findings. She is to call Dr. Georgeann Oppenheim office to schedule a follow-up appointment.    Final Clinical Impressions(s) / UC Diagnoses   Final diagnoses:  Diverticulitis of colon     Discharge Instructions      Your CT scan showed some sigmoid diverticulitis which inflammation of the sigmoid (lower part of her colon).  See handout for more information.  As she had an elevated white blood cell count which is a marker infection we will give you antibiotics.  Please take all of the  antibiotics.  If you start to develop diarrhea, please seek medical care.  Be sure to follow-up with your gastroenterologist, Dr. Vicente Males.   There were small nodules seen in the lower part of your chest.  Be sure to speak with your primary care doctor about these findings. They may warrant additional imaging in the future.      ED Prescriptions     Medication Sig Dispense Auth. Provider   levofloxacin (LEVAQUIN) 750 MG tablet Take 1 tablet (750 mg total) by mouth daily for 7 days. 7 tablet Dillon Livermore, DO   metroNIDAZOLE (FLAGYL) 500 MG tablet Take 1 tablet (500 mg total) by mouth 3 (three) times daily for 7 days. 21 tablet Lyndee Hensen, DO      PDMP not reviewed this encounter.   Lyndee Hensen, DO 11/14/21 1733   Called Kendra Prince to check in with her however she did not answer.  The second number listed was her daughter's cell phone.  Daughter reports her mom is doing somewhat better after taking the medication.  She is experiencing some diarrhea likely related to the contrast given yesterday for her CT.  She is not having any fevers.  She is taking her antibiotics.  I sent him a staff message to Dr. Vicente Males regarding the recommendation for colonoscopy by the radiologist.  Kendra Prince has a follow-up appointment scheduled with Dr. Vicente Males in September.  Daughter confirms that Kendra Prince is not a smoker.  Notes that her mom would likely not want to do anything about the nodules if they were cancerous.  Says that her mom is ready to join her husband who passed away in 06/16/2017.  5:36 PM     Lyndee Hensen, DO 11/14/21 1737

## 2021-11-13 NOTE — ED Triage Notes (Signed)
Pt reports lower abdominal pain. States when she sits she can feel a pressure in the rectum area. Started Mid-day Tuesday. Symptoms began with low back pain then progressed into lower abdominal pain. Believes it may be abdominal spasms. Hx of IBS.  Reports taking half of a Diazepam 5 mg tablet this morning. States her doctor told her this medication will help the the spasms and IBS symptoms.

## 2021-11-14 ENCOUNTER — Telehealth: Payer: Self-pay

## 2021-11-14 NOTE — Telephone Encounter (Signed)
Patient daughter left a message for call back because patient was diagnosed with Diverticulitis on 11/13/2021. They need to know what she needs to eat with the diverticulitis and the IBS

## 2021-11-18 NOTE — Telephone Encounter (Signed)
Called patient back and she verbalized understanding of instructions

## 2021-11-19 MED ORDER — ONDANSETRON 4 MG PO TBDP: 4.0000 mg | ORAL_TABLET | Freq: Three times a day (TID) | ORAL | 0 refills | Status: AC | PRN

## 2021-11-19 NOTE — Addendum Note (Signed)
Addended by: Wayna Chalet on: 11/19/2021 11:01 AM   Modules accepted: Orders

## 2021-11-19 NOTE — Telephone Encounter (Signed)
Patient's daughter-Carla called stating that her mother has had nausea ever since she started taking the antibiotics for her acute diverticulitis. She would like to know if she is able to get Ondansetron. Patient has also developed some blisters in her lower lip and would like to know what she could do for that. Today is her last day of antibiotics. Patient denied fever, chills, abdominal pain, constipation and/or diarrhea. Please advise.

## 2021-11-19 NOTE — Telephone Encounter (Signed)
Called Carla back to give Dr. Georgeann Oppenheim recommendations and she agreed. I told her that I would be sending her prescription to [facility]. Angela Nevin had no further questions.

## 2021-11-20 ENCOUNTER — Telehealth: Payer: Self-pay | Admitting: Emergency Medicine

## 2021-11-20 NOTE — Telephone Encounter (Signed)
LMTCB on daughters VM.  Pt daughter called stating the antibiotics that were prescribed at last visit are making pt sick. She has nausea, stomach pain and her lower lip is peeling. Pt has one pill left.

## 2021-12-24 ENCOUNTER — Other Ambulatory Visit: Payer: Self-pay

## 2021-12-24 DIAGNOSIS — R933 Abnormal findings on diagnostic imaging of other parts of digestive tract: Secondary | ICD-10-CM

## 2021-12-24 DIAGNOSIS — Z8719 Personal history of other diseases of the digestive system: Secondary | ICD-10-CM

## 2021-12-25 ENCOUNTER — Encounter: Payer: Self-pay | Admitting: Gastroenterology

## 2021-12-25 ENCOUNTER — Ambulatory Visit (INDEPENDENT_AMBULATORY_CARE_PROVIDER_SITE_OTHER): Payer: Medicare Other | Admitting: Gastroenterology

## 2021-12-25 VITALS — BP 167/76 | HR 71 | Temp 97.8°F | Wt 133.6 lb

## 2021-12-25 DIAGNOSIS — I2510 Atherosclerotic heart disease of native coronary artery without angina pectoris: Secondary | ICD-10-CM | POA: Diagnosis not present

## 2021-12-25 DIAGNOSIS — R933 Abnormal findings on diagnostic imaging of other parts of digestive tract: Secondary | ICD-10-CM

## 2021-12-25 DIAGNOSIS — Z8719 Personal history of other diseases of the digestive system: Secondary | ICD-10-CM

## 2021-12-25 NOTE — Progress Notes (Signed)
Jonathon Bellows MD, MRCP(U.K) [address]  [place]  [place], [place] [address]  Main: [phone]  Fax: [phone]   Primary Care Physician: Rolanda Jay, MD  Primary Gastroenterologist:  Dr. Jonathon Bellows   No chief complaint on file.   HPI: Kendra Prince is a 88 y.o. female  Summary of history :  She was initially referred and seen back in 11/16/2020 for diarrhea and abdominal pain.  Previously been evaluated by [facility]ology and Dr. Alice Reichert in 2019.  At her office visit she felt the diarrhea had resolved prior to her constipation.  She has IBS constipation.  Plan was to perform colonoscopy if no better at her next visit.  She responded very well to a low FODMAP diet and MiraLAX   Interval history   With a history She presents to the emergency room on 11/13/2021 with abdominal cramps pain underwent a CT scan of the abdomen when she presented to the ER that showed irregular wall thickening of the cecum could relate to partial contrast opacification recommend colonoscopy.  Acute uncomplicated sigmoid diverticulitis small bilateral solid pulmonary nodules noted.  Treated with antibiotics  Since being treated with antibiotics all his GI symptoms have resolved has normal stools no abdominal pain no diarrhea the stool is getting more formed gradually.  Denies any rectal bleeding.  No other complaints. Current Outpatient Medications  Medication Sig Dispense Refill   albuterol (PROVENTIL HFA;VENTOLIN HFA) 108 (90 BASE) MCG/ACT inhaler Inhale 2 puffs into the lungs every 6 (six) hours as needed for wheezing or shortness of breath. 1 Inhaler 0   aspirin EC 81 MG tablet Take 1 tablet (81 mg total) by mouth daily. 90 tablet 3   b complex vitamins capsule Take 1 capsule by mouth daily.     carvedilol (COREG) 3.125 MG tablet TAKE (1) TABLET BY MOUTH TWICE DAILY WITH A MEAL 60 tablet 2   COD LIVER OIL/VITAMINS A & D CAPS Take by mouth daily.     diazepam  (VALIUM) 5 MG tablet Take 5 mg by mouth daily as needed.     famotidine (PEPCID) 10 MG tablet Take 10 mg by mouth daily as needed for heartburn or indigestion.     furosemide (LASIX) 20 MG tablet Take 1 tablet (20 mg total) by mouth daily. 90 tablet 3   gabapentin (NEURONTIN) 100 MG capsule Take 200 mg by mouth at bedtime.     levothyroxine (SYNTHROID, LEVOTHROID) 100 MCG tablet Take 100 mcg by mouth daily.     lisinopril (ZESTRIL) 10 MG tablet Take 1 tablet (10 mg total) by mouth daily. 90 tablet 1   MAGNESIUM CITRATE PO Take 500 mg by mouth daily.     melatonin 5 MG TABS Take 0.5 mg by mouth at bedtime.     nitroGLYCERIN (NITROSTAT) 0.4 MG SL tablet Place 0.4 mg under the tongue every 5 (five) minutes as needed for chest pain.     ondansetron (ZOFRAN-ODT) 4 MG disintegrating tablet Take 1 tablet (4 mg total) by mouth every 8 (eight) hours as needed for nausea or vomiting. 12 tablet 0   valACYclovir (VALTREX) 1000 MG tablet Take 4,000 mg by mouth once.     No current facility-administered medications for this visit.    Allergies as of 12/25/2021 - Review Complete 11/14/2021  Allergen Reaction Noted   Amlodipine Other (See Comments) and Palpitations 02/25/2016   Azithromycin Other (See Comments) 05/27/2015   Norvasc [amlodipine besylate]  08/06/2016   Statins  Other (See Comments) 05/27/2015   Sulfa antibiotics Itching 03/29/2014   Tape  08/08/2015   Codeine Other (See Comments) and Rash 03/29/2014    ROS:  General: Negative for anorexia, weight loss, fever, chills, fatigue, weakness. ENT: Negative for hoarseness, difficulty swallowing , nasal congestion. CV: Negative for chest pain, angina, palpitations, dyspnea on exertion, peripheral edema.  Respiratory: Negative for dyspnea at rest, dyspnea on exertion, cough, sputum, wheezing.  GI: See history of present illness. GU:  Negative for dysuria, hematuria, urinary incontinence, urinary frequency, nocturnal urination.  Endo: Negative  for unusual weight change.    Physical Examination:   There were no vitals taken for this visit.  General: Well-nourished, well-developed in no acute distress.  Eyes: No icterus. Conjunctivae pink. Abdomen: Bowel sounds are normal, nontender, nondistended, no hepatosplenomegaly or masses, no abdominal bruits or hernia , no rebound or guarding.   Extremities: No lower extremity edema. No clubbing or deformities. Neuro: Alert and oriented x 3.  Grossly intact. Skin: Warm and dry, no jaundice.   Psych: Alert and cooperative, normal mood and affect.   Imaging Studies: No results found.  Assessment and Plan:   Kendra Prince is a 88 y.o. y/o female of IBS constipation present to the emergency room on 11/13/2021 with abdominal pain found to have acute sigmoid diverticulitis along with an incidental finding of slight abnormality in the cecum and further evaluation colonoscopy recommended.  Plan 1.  High-fiber diet 2.  Colonoscopy to evaluate for sigmoid diverticulitis as well as abnormality seen in the cecum   I have discussed alternative options, risks & benefits,  which include, but are not limited to, bleeding, infection, perforation,respiratory complication & drug reaction.  The patient agrees with this plan & written consent will be obtained.      Dr Jonathon Bellows  MD,MRCP [facility]) Follow up in as needed

## 2021-12-26 MED ORDER — PEG 3350-KCL-NA BICARB-NACL 420 G PO SOLR: ORAL | 0 refills | Status: DC

## 2021-12-26 NOTE — Addendum Note (Signed)
Addended by: Wayna Chalet on: 12/26/2021 08:42 AM   Modules accepted: Orders

## 2022-01-01 ENCOUNTER — Other Ambulatory Visit: Payer: Self-pay | Admitting: Cardiovascular Disease

## 2022-01-02 ENCOUNTER — Telehealth: Payer: Self-pay | Admitting: Cardiovascular Disease

## 2022-01-02 NOTE — Telephone Encounter (Signed)
*  STAT* If patient is at the pharmacy, call can be transferred to refill team.   1. Which medications need to be refilled? (please list name of each medication and dose if known) Carvedilol  2. Which pharmacy/location (including street and city if local pharmacy) is medication to be sent to?[facility], [place] [employer] [place]  3. Do they need a 30 day or 90 day supply? 90 days and refills- need this today- out after today

## 2022-01-13 ENCOUNTER — Encounter: Payer: Self-pay | Admitting: *Deleted

## 2022-01-21 ENCOUNTER — Ambulatory Visit: Payer: Medicare Other | Attending: Cardiology | Admitting: Cardiology

## 2022-01-21 ENCOUNTER — Encounter: Payer: Self-pay | Admitting: Cardiology

## 2022-01-21 VITALS — BP 148/80 | HR 68 | Ht 59.0 in | Wt 131.4 lb

## 2022-01-21 DIAGNOSIS — I2510 Atherosclerotic heart disease of native coronary artery without angina pectoris: Secondary | ICD-10-CM | POA: Diagnosis present

## 2022-01-21 DIAGNOSIS — I10 Essential (primary) hypertension: Secondary | ICD-10-CM | POA: Insufficient documentation

## 2022-01-21 DIAGNOSIS — I5022 Chronic systolic (congestive) heart failure: Secondary | ICD-10-CM | POA: Diagnosis not present

## 2022-01-21 DIAGNOSIS — I255 Ischemic cardiomyopathy: Secondary | ICD-10-CM | POA: Diagnosis present

## 2022-01-21 DIAGNOSIS — E785 Hyperlipidemia, unspecified: Secondary | ICD-10-CM | POA: Diagnosis present

## 2022-01-21 MED ORDER — CARVEDILOL 3.125 MG PO TABS: ORAL_TABLET | ORAL | 3 refills | Status: DC

## 2022-01-21 NOTE — Progress Notes (Signed)
Cardiology Clinic Note   Patient Name: YONG WAHLQUIST Date of Encounter: 01/21/2022  Primary Care Provider:  Rolanda Jay, MD Primary Cardiologist:  Kathlyn Sacramento, MD  Patient Profile    88 year old female with a history of coronary artery disease status post NSTEMI and DES placement to the LAD in 2015, hypertension, hyperlipidemia (statin intolerant), ischemic cardiomyopathy, HFrEF, LBBB, diabetes, hypothyroidism, who presents today for follow-up of coronary artery disease.  Past Medical History    Past Medical History:  Diagnosis Date   Chronic systolic heart failure ([place])    a. 02/2014 LV gram: EF 40%, mod antlat HK, sev apical HK; b. 03/2017 MV: EF 57%; c. 11/2018 Echo: EF 45-50%, impaired relaxation. Nl RV size/fxn. Mildly dil LA. Mod elev PASP. Asc Ao 69m.   Coronary artery disease    a. 02/2014 NSTEMI/PCI: LM nl, LAD [id number] Xience Alpine DES), LCX 50d, OM1/2 nl, RCA nl, RPDA nl, RPL1 nl, EF 40%; b. 03/2017 MV: EF 57%. Fixed apical and apical septal defect - MI vs attenuation. No ischemia.   Diabetes mellitus without complication (HCC)    Hyperlipidemia    a. Statin intolerant; b. Could not afford Zetia.   Hypertension    Hypothyroidism    Ischemic cardiomyopathy    a. 02/2014 LV gram: EF 40%, mod antlat HK, sev apical HK.   LBBB (left bundle branch block)    MI (myocardial infarction) ([place]/2015   Past Surgical History:  Procedure Laterality Date   CARDIAC CATHETERIZATION  03/16/2014   x1 stent   CARPAL TUNNEL RELEASE     MOHS SURGERY     SHOULDER SURGERY     TONSILLECTOMY     TOTAL ABDOMINAL HYSTERECTOMY     TOTAL KNEE ARTHROPLASTY Left     Allergies  Allergies  Allergen Reactions   Amlodipine Other (See Comments) and Palpitations   Azithromycin Other (See Comments)    Other reaction(s): Vomiting   Norvasc [Amlodipine Besylate]     Shakes     Statins Other (See Comments)    Myalgia Crestor and Atorvastatin   Sulfa Antibiotics  Itching   Tape    Codeine Other (See Comments) and Rash    hallucinations    History of Present Illness    Nancy L. ShDaigneaults an [age]ear old female with the above past medical history including coronary artery disease, hypertension, hyperlipidemia, statin intolerance, ischemic cardiomyopathy, HFrEF, LBBB, and hypothyroidism.  She previously suffered an NSTEMI in December 2015 with catheterization at that time revealing a 99% lesion in the mid LAD, successful placement of DES.  Echocardiogram at that time showed EF of 40%.  In January 2019 she underwent stress testing in the setting of abnormal ECG.  This showed no evidence of ischemia and EF was measured at 57%.  At follow-up in September 2020 she had a repeat echocardiogram which revealed an EF of 45-50% with moderate pulmonary hypertension and a PASP of 46 mmHg.  She was last seen in clinic on 07/25/2021 by Dr. ArFletcher Anon Overall at that time she was doing well with no chest pain.  She reported stable exertional dyspnea and some mild lower extremity edema.  She was using furosemide on a as needed basis when she had increased swelling to her bilateral lower extremities.  There were no medication changes made and no outpatient testing that was ordered.  She returns to clinic today stating that she has been doing fairly well.  She does endorse some chronic exertional dyspnea  and occasional lower extremity edema.  She denies any recurrent chest discomfort, palpitations, or chest pressure.  She has been having issues with her IBS and has been following up with GI.  She was evaluated at the [facility] urgent care for abdominal pain from a recent IBS flare and is now preparing for colonoscopy.   Home Medications    Current Outpatient Medications  Medication Sig Dispense Refill   albuterol (PROVENTIL HFA;VENTOLIN HFA) 108 (90 BASE) MCG/ACT inhaler Inhale 2 puffs into the lungs every 6 (six) hours as needed for wheezing or shortness of breath. 1 Inhaler 0    aspirin EC 81 MG tablet Take 1 tablet (81 mg total) by mouth daily. 90 tablet 3   b complex vitamins capsule Take 1 capsule by mouth daily.     COD LIVER OIL/VITAMINS A & D CAPS Take by mouth daily.     diazepam (VALIUM) 5 MG tablet Take 5 mg by mouth daily as needed.     furosemide (LASIX) 20 MG tablet Take 1 tablet (20 mg total) by mouth daily. 90 tablet 3   gabapentin (NEURONTIN) 100 MG capsule Take 200 mg by mouth at bedtime.     levothyroxine (SYNTHROID, LEVOTHROID) 100 MCG tablet Take 100 mcg by mouth daily.     lisinopril (ZESTRIL) 10 MG tablet Take 1 tablet (10 mg total) by mouth daily. 90 tablet 1   MAGNESIUM CITRATE PO Take 500 mg by mouth daily.     melatonin 5 MG TABS Take 0.5 mg by mouth at bedtime.     nitroGLYCERIN (NITROSTAT) 0.4 MG SL tablet Place 0.4 mg under the tongue every 5 (five) minutes as needed for chest pain.     ondansetron (ZOFRAN-ODT) 4 MG disintegrating tablet Take 1 tablet (4 mg total) by mouth every 8 (eight) hours as needed for nausea or vomiting. 12 tablet 0   polyethylene glycol-electrolytes (NULYTELY) 420 g solution Prepare according to package instructions. Starting at 5:00 PM: Drink one 8 oz glass of mixture every 15 minutes until you finish half of the jug. Five hours prior to procedure, drink 8 oz glass of mixture every 15 minutes until it is all gone. Make sure you do not drink anything 4 hours prior to your procedure. 4000 mL 0   valACYclovir (VALTREX) 1000 MG tablet Take 4,000 mg by mouth once.     carvedilol (COREG) 3.125 MG tablet TAKE (1) TABLET BY MOUTH TWICE DAILY WITH A MEAL 180 tablet 3   No current facility-administered medications for this visit.     Family History    Family History  Problem Relation Age of Onset   Heart attack Father 68   Heart attack Brother 4   She indicated that her mother is deceased. She indicated that her father is deceased. She indicated that her brother is deceased.  Social History    Social History    Socioeconomic History   Marital status: Widowed    Spouse name: Not on file   Number of children: Not on file   Years of education: Not on file   Highest education level: Not on file  Occupational History   Not on file  Tobacco Use   Smoking status: Never   Smokeless tobacco: Never  Vaping Use   Vaping Use: Never used  Substance and Sexual Activity   Alcohol use: No   Drug use: No   Sexual activity: Not on file  Other Topics Concern   Not on file  Social History  Narrative   Not on file   Social Determinants of Health   Financial Resource Strain: Not on file  Food Insecurity: Not on file  Transportation Needs: Not on file  Physical Activity: Not on file  Stress: Not on file  Social Connections: Not on file  Intimate Partner Violence: Not on file     Review of Systems    General:  No chills, fever, night sweats or weight changes.  Endorses fatigue Cardiovascular:  No chest pain, endorses chronic exertional dyspnea on exertion, notices occasional edema, orthopnea, palpitations, paroxysmal nocturnal dyspnea. Dermatological: No rash, lesions/masses Respiratory: No cough, versus chronic exertional dyspnea Urologic: No hematuria, dysuria Abdominal:   No nausea, vomiting, diarrhea, bright red blood per rectum, melena, or hematemesis Neurologic:  No visual changes, wkns, changes in mental status. All other systems reviewed and are otherwise negative except as noted above.   Physical Exam    VS:  BP (!) 148/80 (BP Location: Left Arm, Patient Position: Sitting, Cuff Size: Normal)   Pulse 68   Ht '4\' 11"'$  (1.499 m)   Wt 131 lb 6 oz (59.6 kg)   SpO2 96%   BMI 26.53 kg/m  , BMI Body mass index is 26.53 kg/m.     GEN: Well nourished, well developed, in no acute distress. HEENT: normal. Neck: Supple, no JVD, carotid bruits, or masses. Cardiac: RRR, II/VI  murmur, without rubs, or gallops. No clubbing, cyanosis, trace pretibial edema.  Radials/DP/PT 2+ and equal  bilaterally.  Respiratory:  Respirations regular and unlabored, clear to auscultation bilaterally. GI: Soft, nontender, nondistended, BS + x 4. MS: no deformity or atrophy. Skin: warm and dry, no rash. Neuro:  Strength and sensation are intact. Psych: Normal affect.  Accessory Clinical Findings    ECG personally reviewed by me today-sinus rhythm with first-degree AV block,  left bundle branch block, and PACs, left axis deviation- No acute changes  Lab Results  Component Value Date   WBC 13.9 (H) 11/13/2021   HGB 13.1 11/13/2021   HCT 36.4 11/13/2021   MCV 88.8 11/13/2021   PLT 219 11/13/2021   Lab Results  Component Value Date   CREATININE 0.65 11/13/2021   BUN 12 11/13/2021   NA 132 (L) 11/13/2021   K 4.2 11/13/2021   CL 98 11/13/2021   CO2 27 11/13/2021   Lab Results  Component Value Date   ALT 15 11/13/2021   AST 19 11/13/2021   ALKPHOS 65 11/13/2021   BILITOT 1.3 (H) 11/13/2021   Lab Results  Component Value Date   CHOL 200 (H) 09/20/2015   HDL 44 09/20/2015   LDLCALC 125 (H) 09/20/2015   TRIG 156 (H) 09/20/2015   CHOLHDL 4.5 (H) 09/20/2015    No results found for: "HGBA1C"  Assessment & Plan   1.  Coronary artery disease involving native coronary arteries without angina.  Overall she denies any anginal symptoms.  She has been continued on aspirin indefinitely, remains on carvedilol, and has Nitrostat as needed.  She had an NSTEMI/PCI in 02/2014 with DES placement that was uncomplicated.  Last Myoview was in 2019 which showed an EF of 57%, fixed apical and apical septal defect with no ischemia.  EKG today reveals an  left bundle branch block  2.  Chronic systolic congestive heart failure the last echocardiogram being done 11/2018.  Last LVEF was 45-50% with mildly increased left ventricular hypertrophy, moderately elevated pulmonary systolic pressure, mildly dilated left atrium.  With continued shortness of breath on exam today she  has been scheduled for an  echocardiogram.  She has been continued on carvedilol, furosemide, and lisinopril.  3.  Essential hypertension blood pressure today 148/80 which is relatively stable she says that she does get lower blood pressures when she monitors at home.  She has been continued on carvedilol and lisinopril.  She has been encouraged to continue to monitor her pressures while at home.  4.  Hyperlipidemia without a recent lipid panel.  Previously had been followed by her PCP reviewing of previous notes she has been intolerant of all statins and did not tolerate Zetia due to GI symptoms.  She has been encouraged to continue on diet program given by her gastroenterologist at this time.  Will need repeat lipid panel on return visit.  Last lipid panel was from 2020.  5.  Disposition patient return to clinic to see MD/APP in 6 months or sooner if needed.  Whitney Hillegass, NP 01/21/2022, 2:55 PM

## 2022-01-21 NOTE — Patient Instructions (Addendum)
Medication Instructions:  No changes at this time.   *If you need a refill on your cardiac medications before your next appointment, please call your pharmacy*   Lab Work: None  If you have labs (blood work) drawn today and your tests are completely normal, you will receive your results only by: [place] (if you have MyChart) OR A paper copy in the mail If you have any lab test that is abnormal or we need to change your treatment, we will call you to review the results.   Testing/Procedures: Your physician has requested that you have an echocardiogram. Echocardiography is a painless test that uses sound waves to create images of your heart. It provides your doctor with information about the size and shape of your heart and how well your heart's chambers and valves are working. This procedure takes approximately one hour. There are no restrictions for this procedure. Please do NOT wear cologne, perfume, aftershave, or lotions (deodorant is allowed). Please arrive 15 minutes prior to your appointment time.    Follow-Up: At [facility], you and your health needs are our priority.  As part of our continuing mission to provide you with exceptional heart care, we have created designated Provider Care Teams.  These Care Teams include your primary Cardiologist (physician) and Advanced Practice Providers (APPs -  Physician Assistants and Nurse Practitioners) who all work together to provide you with the care you need, when you need it.  Your next appointment:   6 month(s)  The format for your next appointment:   In Person  Provider:   Kathlyn Sacramento, MD or Gerrie Nordmann, NP         Important Information About Sugar

## 2022-01-26 ENCOUNTER — Telehealth: Payer: Self-pay

## 2022-01-26 NOTE — Telephone Encounter (Signed)
Patient has requested to cancel her colonoscopy.  She said at her age she'd rather not have it done and she is feeling better.  Endoscopy has been notified.  Thanks,  Seis Lagos, [place]

## 2022-01-27 ENCOUNTER — Ambulatory Visit: Payer: Medicare Other | Admitting: Cardiovascular Disease

## 2022-01-28 ENCOUNTER — Ambulatory Visit: Admission: RE | Admit: 2022-01-28 | Payer: Medicare Other | Source: Home / Self Care | Admitting: Gastroenterology

## 2022-01-28 ENCOUNTER — Encounter: Admission: RE | Payer: Self-pay | Source: Home / Self Care

## 2022-01-28 SURGERY — COLONOSCOPY WITH PROPOFOL
Anesthesia: General

## 2022-02-17 ENCOUNTER — Ambulatory Visit: Payer: Medicare Other | Attending: Cardiology

## 2022-02-17 DIAGNOSIS — I255 Ischemic cardiomyopathy: Secondary | ICD-10-CM | POA: Insufficient documentation

## 2022-02-17 LAB — ECHOCARDIOGRAM COMPLETE
AV Mean grad: 3 mmHg
AV Peak grad: 5.1 mmHg
AV Vena cont: 0.4 cm
Ao pk vel: 1.13 m/s
Area-P 1/2: 3.79 cm2
Calc EF: 59.9 %
P 1/2 time: 628 msec
S' Lateral: 3.5 cm
Single Plane A2C EF: 68.9 %
Single Plane A4C EF: 57.4 %

## 2022-05-05 ENCOUNTER — Other Ambulatory Visit: Payer: Self-pay | Admitting: Cardiovascular Disease

## 2022-07-16 ENCOUNTER — Other Ambulatory Visit: Payer: Self-pay | Admitting: Emergency Medicine

## 2022-07-16 DIAGNOSIS — M5412 Radiculopathy, cervical region: Secondary | ICD-10-CM

## 2022-07-23 ENCOUNTER — Encounter: Payer: Self-pay | Admitting: Cardiovascular Disease

## 2022-07-23 ENCOUNTER — Ambulatory Visit: Payer: Medicare Other | Attending: Cardiovascular Disease | Admitting: Cardiovascular Disease

## 2022-07-23 VITALS — BP 140/80 | HR 59 | Ht 59.0 in | Wt 136.0 lb

## 2022-07-23 DIAGNOSIS — E785 Hyperlipidemia, unspecified: Secondary | ICD-10-CM | POA: Diagnosis not present

## 2022-07-23 DIAGNOSIS — I2510 Atherosclerotic heart disease of native coronary artery without angina pectoris: Secondary | ICD-10-CM | POA: Insufficient documentation

## 2022-07-23 DIAGNOSIS — I10 Essential (primary) hypertension: Secondary | ICD-10-CM | POA: Insufficient documentation

## 2022-07-23 DIAGNOSIS — I5022 Chronic systolic (congestive) heart failure: Secondary | ICD-10-CM | POA: Diagnosis not present

## 2022-07-23 NOTE — Progress Notes (Signed)
Cardiology Office Note   Date:  07/23/2022   ID:  Kendra, Prince 28-Jun-1931, MRN [id number]  PCP:  Bary Leriche, MD  Cardiologist:   Lorine Bears, MD   Chief Complaint  Patient presents with   Follow-up    6 month f/u no complaints today. Meds reviewed verbally with pt.      History of Present Illness: Kendra Prince is a 89 y.o. female who presents for a follow-up visit regarding coronary artery disease and heart failure. She has known history of hypertension and type 2 diabetes. She has history of palpitations controlled with atenolol. She presented in December 2015 with non-ST elevation myocardial infarction with anterior ischemic changes on EKG. She underwent cardiac catheterization which showed 99% stenosis in the mid LAD with ejection fraction of 40%. PCI and drug-eluting stent placement was performed to the LAD without complications.  She has known history of hyperlipidemia with known intolerance to all statins.   Most recent ischemic work-up was in January 2019 with a [facility] which showed no evidence of ischemia with normal ejection fraction at 57%. Echocardiogram in September 2020  showed an EF of 45 to 50% with moderate pulmonary hypertension with systolic pulmonary pressure of 46 mmHg.  Most recent echocardiogram in November 2023 showed an EF of 50 to 55% with mild pulmonary hypertension  She has been doing reasonably well with no chest pain or worsening dyspnea.  Past Medical History:  Diagnosis Date   Chronic systolic heart failure    a. 02/2014 LV gram: EF 40%, mod antlat HK, sev apical HK; b. 03/2017 MV: EF 57%; c. 11/2018 Echo: EF 45-50%, impaired relaxation. Nl RV size/fxn. Mildly dil LA. Mod elev PASP. Asc Ao 36mm.   Coronary artery disease    a. 02/2014 NSTEMI/PCI: LM nl, LAD 26m (2.5x33 Xience Alpine DES), LCX 50d, OM1/2 nl, RCA nl, RPDA nl, RPL1 nl, EF 40%; b. 03/2017 MV: EF 57%. Fixed apical and apical septal defect - MI vs  attenuation. No ischemia.   Diabetes mellitus without complication    Hyperlipidemia    a. Statin intolerant; b. Could not afford Zetia.   Hypertension    Hypothyroidism    Ischemic cardiomyopathy    a. 02/2014 LV gram: EF 40%, mod antlat HK, sev apical HK.   LBBB (left bundle branch block)    MI (myocardial infarction) 02/2014    Past Surgical History:  Procedure Laterality Date   CARDIAC CATHETERIZATION  03/16/2014   x1 stent   CARPAL TUNNEL RELEASE     MOHS SURGERY     SHOULDER SURGERY     TONSILLECTOMY     TOTAL ABDOMINAL HYSTERECTOMY     TOTAL KNEE ARTHROPLASTY Left      Current Outpatient Medications  Medication Sig Dispense Refill   albuterol (PROVENTIL HFA;VENTOLIN HFA) 108 (90 BASE) MCG/ACT inhaler Inhale 2 puffs into the lungs every 6 (six) hours as needed for wheezing or shortness of breath. 1 Inhaler 0   aspirin EC 81 MG tablet Take 1 tablet (81 mg total) by mouth daily. 90 tablet 3   b complex vitamins capsule Take 1 capsule by mouth daily.     carvedilol (COREG) 3.125 MG tablet TAKE (1) TABLET BY MOUTH TWICE DAILY WITH A MEAL 180 tablet 3   COD LIVER OIL/VITAMINS A & D CAPS Take by mouth daily.     diazepam (VALIUM) 5 MG tablet Take 5 mg by mouth daily as needed.     furosemide (  LASIX) 20 MG tablet Take 1 tablet (20 mg total) by mouth daily. 90 tablet 3   gabapentin (NEURONTIN) 100 MG capsule Take 200 mg by mouth at bedtime.     levothyroxine (SYNTHROID, LEVOTHROID) 100 MCG tablet Take 100 mcg by mouth daily.     lisinopril (ZESTRIL) 10 MG tablet TAKE (1) TABLET BY MOUTH EVERY DAY 90 tablet 1   MAGNESIUM CITRATE PO Take 500 mg by mouth daily.     melatonin 5 MG TABS Take 0.5 mg by mouth at bedtime.     nitroGLYCERIN (NITROSTAT) 0.4 MG SL tablet Place 0.4 mg under the tongue every 5 (five) minutes as needed for chest pain.     ondansetron (ZOFRAN-ODT) 4 MG disintegrating tablet Take 1 tablet (4 mg total) by mouth every 8 (eight) hours as needed for nausea or  vomiting. 12 tablet 0   valACYclovir (VALTREX) 1000 MG tablet Take 4,000 mg by mouth once.     No current facility-administered medications for this visit.    Allergies:   Amlodipine, Azithromycin, Norvasc [amlodipine besylate], Statins, Sulfa antibiotics, Tape, and Codeine    Social History:  The patient  reports that she has never smoked. She has never used smokeless tobacco. She reports that she does not drink alcohol and does not use drugs.   Family History:  The patient's family history includes Heart attack (age of onset: 62) in her brother; Heart attack (age of onset: 68) in her father.    ROS:  Please see the history of present illness.   Otherwise, review of systems are positive for none.   All other systems are reviewed and negative.    PHYSICAL EXAM: VS:  BP (!) 140/80 (BP Location: Left Arm, Patient Position: Sitting, Cuff Size: Normal)   Pulse (!) 59   Ht 4\' 11"  (1.499 m)   Wt 136 lb (61.7 kg)   SpO2 97%   BMI 27.47 kg/m  , BMI Body mass index is 27.47 kg/m. GEN: Well nourished, well developed, in no acute distress  HEENT: normal  Neck: no JVD, carotid bruits, or masses Cardiac: RRR; no  rubs, or gallops, trace bilateral leg edema.  1 /6 systolic murmur in the aortic area Respiratory:  clear to auscultation bilaterally, normal work of breathing GI: soft, nontender, nondistended, + BS MS: no deformity or atrophy  Skin: warm and dry, no rash Neuro:  Strength and sensation are intact Psych: euthymic mood, full affect Dorsalis pedis is normal bilaterally  EKG:  EKG is ordered today. The ekg ordered today demonstrates sinus bradycardia with first-degree AV block and left bundle branch block   Recent Labs: 11/13/2021: ALT 15; BUN 12; Creatinine, Ser 0.65; Hemoglobin 13.1; Platelets 219; Potassium 4.2; Sodium 132    Lipid Panel    Component Value Date/Time   CHOL 200 (H) 09/20/2015 0928   CHOL 145 03/16/2014 0538   TRIG 156 (H) 09/20/2015 0928   TRIG 173  03/16/2014 0538   HDL 44 09/20/2015 0928   HDL 40 03/16/2014 0538   CHOLHDL 4.5 (H) 09/20/2015 0928   VLDL 35 03/16/2014 0538   LDLCALC 125 (H) 09/20/2015 0928   LDLCALC 70 03/16/2014 0538      Wt Readings from Last 3 Encounters:  07/23/22 136 lb (61.7 kg)  01/21/22 131 lb 6 oz (59.6 kg)  12/25/21 133 lb 9.6 oz (60.6 kg)        ASSESSMENT AND PLAN:  1.  Coronary artery disease involving native coronary arteries without angina:  She  is doing well overall with no anginal symptoms. Continue aspirin indefinitely.   2. Hyperlipidemia:  Unfortunately, she is intolerant to all statins.  Also she did not tolerate Zetia due to GI symptoms.  She seems to have allergies to multiple medications.  Continue with lifestyle changes.  3. Essential hypertension: Blood pressure is reasonably controlled considering her age.  Continue carvedilol and lisinopril.  4. Chronic systolic heart failure: Most recent ejection fraction was 50 to 55%.  She does not require furosemide very often.  She appears to be euvolemic.    Disposition:   FU with me in 6 months  Signed,  Lorine Bears, MD  07/23/2022 9:22 AM    [facility]

## 2022-07-23 NOTE — Patient Instructions (Signed)
Medication Instructions:  No changes *If you need a refill on your cardiac medications before your next appointment, please call your pharmacy*   Lab Work: None ordered If you have labs (blood work) drawn today and your tests are completely normal, you will receive your results only by: MyChart Message (if you have MyChart) OR A paper copy in the mail If you have any lab test that is abnormal or we need to change your treatment, we will call you to review the results.   Testing/Procedures: None ordered   Follow-Up: At [facility], you and your health needs are our priority.  As part of our continuing mission to provide you with exceptional heart care, we have created designated Provider Care Teams.  These Care Teams include your primary Cardiologist (physician) and Advanced Practice Providers (APPs -  Physician Assistants and Nurse Practitioners) who all work together to provide you with the care you need, when you need it.  We recommend signing up for the patient portal called "MyChart".  Sign up information is provided on this After Visit Summary.  MyChart is used to connect with patients for Virtual Visits (Telemedicine).  Patients are able to view lab/test results, encounter notes, upcoming appointments, etc.  Non-urgent messages can be sent to your provider as well.   To learn more about what you can do with MyChart, go to [email or web address].    Your next appointment:   6 month(s)  Provider:   You may see Muhammad Arida, MD or one of the following Advanced Practice Providers on your designated Care Team:   Christopher Berge, NP Ryan Dunn, PA-C Cadence Furth, PA-C Sheri Hammock, NP    

## 2022-08-08 ENCOUNTER — Ambulatory Visit
Admission: RE | Admit: 2022-08-08 | Discharge: 2022-08-08 | Disposition: A | Discharge status: Home / Self Care | Payer: Medicare Other | Source: Ambulatory Visit | Attending: Emergency Medicine | Admitting: Emergency Medicine

## 2022-08-08 DIAGNOSIS — M5412 Radiculopathy, cervical region: Secondary | ICD-10-CM

## 2022-10-26 ENCOUNTER — Other Ambulatory Visit: Payer: Self-pay | Admitting: Cardiovascular Disease

## 2022-11-06 ENCOUNTER — Encounter: Payer: Self-pay | Admitting: Cardiovascular Disease

## 2022-11-16 ENCOUNTER — Emergency Department
Admission: EM | Admit: 2022-11-16 | Discharge: 2022-11-16 | Disposition: A | Discharge status: Home / Self Care | Payer: Medicare Other | Attending: Emergency Medicine | Admitting: Emergency Medicine

## 2022-11-16 ENCOUNTER — Other Ambulatory Visit: Payer: Self-pay

## 2022-11-16 DIAGNOSIS — R531 Weakness: Secondary | ICD-10-CM | POA: Diagnosis not present

## 2022-11-16 DIAGNOSIS — L7622 Postprocedural hemorrhage and hematoma of skin and subcutaneous tissue following other procedure: Secondary | ICD-10-CM | POA: Insufficient documentation

## 2022-11-16 DIAGNOSIS — I2510 Atherosclerotic heart disease of native coronary artery without angina pectoris: Secondary | ICD-10-CM | POA: Diagnosis not present

## 2022-11-16 DIAGNOSIS — R58 Hemorrhage, not elsewhere classified: Secondary | ICD-10-CM

## 2022-11-16 DIAGNOSIS — E119 Type 2 diabetes mellitus without complications: Secondary | ICD-10-CM | POA: Insufficient documentation

## 2022-11-16 LAB — COMPREHENSIVE METABOLIC PANEL
ALT: 14 U/L (ref 0–44)
AST: 24 U/L (ref 15–41)
Albumin: 4 g/dL (ref 3.5–5.0)
Alkaline Phosphatase: 54 U/L (ref 38–126)
Anion gap: 11 (ref 5–15)
BUN: 16 mg/dL (ref 8–23)
CO2: 21 mmol/L — ABNORMAL LOW (ref 22–32)
Calcium: 9.1 mg/dL (ref 8.9–10.3)
Chloride: 99 mmol/L (ref 98–111)
Creatinine, Ser: 0.71 mg/dL (ref 0.44–1.00)
GFR, Estimated: >60 mL/min (ref >60)
Glucose, Bld: 153 mg/dL — ABNORMAL HIGH (ref 70–99)
Potassium: 4.4 mmol/L (ref 3.5–5.1)
Sodium: 131 mmol/L — ABNORMAL LOW (ref 135–145)
Total Bilirubin: 1 mg/dL (ref 0.3–1.2)
Total Protein: 6.4 g/dL — ABNORMAL LOW (ref 6.5–8.1)

## 2022-11-16 LAB — CBC WITH DIFFERENTIAL/PLATELET
Abs Immature Granulocytes: 0.02 10*3/uL (ref 0.00–0.07)
Basophils Absolute: 0 10*3/uL (ref 0.0–0.1)
Basophils Relative: 1 %
Eosinophils Absolute: 0.1 10*3/uL (ref 0.0–0.5)
Eosinophils Relative: 1 %
HCT: 36 % (ref 36.0–46.0)
Hemoglobin: 12.6 g/dL (ref 12.0–15.0)
Immature Granulocytes: 0 %
Lymphocytes Relative: 24 %
Lymphs Abs: 1.7 10*3/uL (ref 0.7–4.0)
MCH: 31.3 pg (ref 26.0–34.0)
MCHC: 35 g/dL (ref 30.0–36.0)
MCV: 89.6 fL (ref 80.0–100.0)
Monocytes Absolute: 0.6 10*3/uL (ref 0.1–1.0)
Monocytes Relative: 8 %
Neutro Abs: 4.7 10*3/uL (ref 1.7–7.7)
Neutrophils Relative %: 66 %
Platelets: 269 10*3/uL (ref 150–400)
RBC: 4.02 MIL/uL (ref 3.87–5.11)
RDW: 12.3 % (ref 11.5–15.5)
WBC: 7.1 10*3/uL (ref 4.0–10.5)
nRBC: 0 % (ref 0.0–0.2)

## 2022-11-16 LAB — TYPE AND SCREEN
ABO/RH(D): A POS
Antibody Screen: NEGATIVE

## 2022-11-16 MED ORDER — SODIUM CHLORIDE 0.9 % IV BOLUS
1000.0000 mL | Freq: Once | INTRAVENOUS | Status: AC
  Administered 2022-11-16: 1000 mL via INTRAVENOUS

## 2022-11-16 NOTE — ED Notes (Signed)
See triage note  Presents with family  States she had some surgery to right hand last week  Developed some bleeding this am from surgery site  Pt became diaphoretic while in lobby  Placed on Scientist, research (medical) in with pt on arrival

## 2022-11-16 NOTE — ED Notes (Signed)
Family member approached this RN and stated that her mom felt like she was going to pass out. This RN asked family what her mom's name was and that we just got shift report. Family member stormed off. Family member came back shortly after and yelled that her mom needed medical attention now! This RN stated to the family that Duwayne Heck EDT was coming over right now  to check her vitals.

## 2022-11-16 NOTE — ED Triage Notes (Addendum)
Pt to ed via ems from home, pt had Mohs surgery at [facility] in [place] last Monday. Pt woke up this morning with the incision site bleeding. Per ems there were clots. Pt hand still bleeding slowly at this time, hand rewrapped with pressure. Pt does not take blood thinners. Pt denise weakness

## 2022-11-16 NOTE — Discharge Instructions (Signed)
Keep your scheduled follow-up with your dermatologist later today.  Return to the ER for new or worsening symptoms.

## 2022-11-16 NOTE — ED Provider Notes (Signed)
[facility] Provider Note    Event Date/Time   First MD Initiated Contact with Patient 11/16/22 0719     (approximate)   History   Post-op Problem   HPI  Kendra Prince is a 89 y.o. female with history of CAD, diabetes presenting to the emergency department for evaluation of bleeding in her hand.  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  She had kept her dressing in place until this morning without issues.  However this morning, was noted to have significant bleeding from the procedure site.  Patient also began to feel lightheaded and weak.  In the setting of this, she presented to the ER.  EMS noted clots on site, had some active bleeding in triage.  After standing up, did have low blood pressure in triage, improved at the time of my initial evaluation in her room.  Patient is not on anticoagulation.  Patient does have a follow-up appointment with her dermatologist scheduled for this afternoon.    Physical Exam   Triage Vital Signs: ED Triage Vitals  Encounter Vitals Group     BP 11/16/22 0636 (!) 164/75     Systolic BP Percentile --      Diastolic BP Percentile --      Pulse Rate 11/16/22 0636 80     Resp 11/16/22 0636 18     Temp 11/16/22 0636 98.4 F (36.9 C)     Temp Source 11/16/22 0636 Oral     SpO2 11/16/22 0636 96 %     Weight 11/16/22 0637 132 lb (59.9 kg)     Height 11/16/22 0637 4\' 11"  (1.499 m)     Head Circumference --      Peak Flow --      Pain Score 11/16/22 0637 0     Pain Loc --      Pain Education --      Exclude from Growth Chart --     Most recent vital signs: Vitals:   11/16/22 0724 11/16/22 0729  BP: (!) 154/64 (!) 145/70  Pulse: 65 60  Resp: 17 16  Temp:    SpO2:  97%     General: Awake, interactive  CV:  Regular rate, good peripheral perfusion.  Resp:  Lungs clear, unlabored respirations.  Abd:  Soft, nondistended.  Neuro:  Symmetric facial movement, fluid  speech MSK:  Dressing in place over the right hand.  When this is taken down there is a rectangular region consistent with a skin graft on the dorsal aspect of the hand with a small area of active bleeding along the lateral portion.  This was able to be controlled with direct pressure and Surgicel.  A dressing was applied with Surgicel, nonstick dressing, gauze, and Coban without recurrent bleeding.  ED Results / Procedures / Treatments   Labs (all labs ordered are listed, but only abnormal results are displayed) Labs Reviewed  COMPREHENSIVE METABOLIC PANEL - Abnormal; Notable for the following components:      Result Value   Sodium 131 (*)    CO2 21 (*)    Glucose, Bld 153 (*)    Total Protein 6.4 (*)    All other components within normal limits  CBC WITH DIFFERENTIAL/PLATELET  TYPE AND SCREEN     EKG EKG independently reviewed interpreted by myself (ER attending) demonstrates:    RADIOLOGY Imaging independently reviewed and interpreted by myself demonstrates:    PROCEDURES:  Critical Care  performed: No  Procedures   MEDICATIONS ORDERED IN ED: Medications  sodium chloride 0.9 % bolus 1,000 mL (1,000 mLs Intravenous New Bag/Given 11/16/22 0749)     IMPRESSION / MDM / ASSESSMENT AND PLAN / ED COURSE  I reviewed the triage vital signs and the nursing notes.  Differential diagnosis includes, but is not limited to, lightheadedness and vagal symptoms in the setting of active bleeding, consideration for acute blood loss anemia though overall small area of bleeding on exam  Patient's presentation is most consistent with acute presentation with potential threat to life or bodily function.  89 year old female presenting to the emergency department for evaluation of postprocedure bleeding.  1 episode of hypotension here, resolved on immediate repeat, suspect likely vagal response.  Labs with stable hemoglobin at 12.6, 13.1 a year ago.  CMP without severe derangement.  No  indication for blood transfusion.  Patient was observed with dressing in place without recurrent bleeding.  She was ambulated without recurrent lightheadedness symptoms.  Do think she is stable for discharge with outpatient follow-up with her dermatologist later today.  Strict return precautions provided.  Patient discharged stable condition.      FINAL CLINICAL IMPRESSION(S) / ED DIAGNOSES   Final diagnoses:  Weakness  Bleeding     Rx / DC Orders   ED Discharge Orders     None        Note:  This document was prepared using Dragon voice recognition software and may include unintentional dictation errors.   Trinna Post, MD 11/16/22 [phone]

## 2022-11-24 ENCOUNTER — Telehealth: Payer: Self-pay

## 2022-11-24 NOTE — Telephone Encounter (Signed)
Transition Care Management Unsuccessful Follow-up Telephone Call  Date of discharge and from where:  11/16/2022 [facility]  Attempts:  1st Attempt  Reason for unsuccessful TCM follow-up call:  No answer/busy  Enid Maultsby [facility]  [facility] Population Health Community Resource Care Guide   [email or web address]  ?? [phone]   Website: [email or web address]  [email or web address]

## 2022-11-24 NOTE — Telephone Encounter (Signed)
Transition Care Management Unsuccessful Follow-up Telephone Call  Date of discharge and from where:  11/16/2022 [facility]  Attempts:  2nd Attempt  Reason for unsuccessful TCM follow-up call:  Left voice message  Dilan Fullenwider [facility]  [facility] Population Health Community Resource Care Guide   [email or web address]  ?? [phone]   Website: [email or web address]  [email or web address]

## 2022-12-04 ENCOUNTER — Inpatient Hospital Stay: Payer: Medicare Other | Attending: Internal Medicine | Admitting: Internal Medicine

## 2022-12-04 ENCOUNTER — Encounter: Payer: Self-pay | Admitting: Internal Medicine

## 2022-12-04 ENCOUNTER — Inpatient Hospital Stay: Payer: Medicare Other

## 2022-12-04 VITALS — BP 157/78 | HR 71 | Temp 98.9°F | Wt 132.0 lb

## 2022-12-04 DIAGNOSIS — Z809 Family history of malignant neoplasm, unspecified: Secondary | ICD-10-CM

## 2022-12-04 DIAGNOSIS — Z79899 Other long term (current) drug therapy: Secondary | ICD-10-CM | POA: Diagnosis not present

## 2022-12-04 DIAGNOSIS — Z993 Dependence on wheelchair: Secondary | ICD-10-CM | POA: Diagnosis not present

## 2022-12-04 DIAGNOSIS — Z808 Family history of malignant neoplasm of other organs or systems: Secondary | ICD-10-CM | POA: Diagnosis not present

## 2022-12-04 DIAGNOSIS — R233 Spontaneous ecchymoses: Secondary | ICD-10-CM

## 2022-12-04 LAB — APTT: aPTT: 32 s (ref 24–36)

## 2022-12-04 LAB — PROTIME-INR
INR: 1 (ref 0.8–1.2)
Prothrombin Time: 13.3 s (ref 11.4–15.2)

## 2022-12-04 NOTE — Progress Notes (Signed)
[name] CONSULT NOTE  Patient Care Team: Bary Leriche, MD as PCP - General (Internal Medicine) [place] Ouch, MD as PCP - Cardiology (Cardiology) [place] Ouch, MD as Consulting Physician (Cardiology) Earna Coder, MD as Consulting Physician (Oncology)  CHIEF COMPLAINTS/PURPOSE OF CONSULTATION: Easy bruising   HEMATOLOGY HISTORY:  # EASY BRUISING:   HISTORY OF PRESENTING ILLNESS: Patient ambulating- with assistance/wheel chair.  Accompanied by daughter.    Kendra Prince 89 y.o.  female has been referred to [place] for further evaluation/work-up for easy bruising/bleeding.   Patient s/p Moh's surgery [Dr.Goldstein] on right aug 12th. 1 week later- noted to have bleeding [also in ibuprofen]  Patient feels like she is getting a little bit stronger Monday 2 weeks ago, since her procedure, she started bleeding small amount throughout the night. She is feeling fatigued. Her right hand is real swollen.   Hx of bruising s/p falls.   Prior evaluation with hematology: None Gum/Nose bleed: none Blood in stools: None Blood in urine: None Vaginal bleeding: None Liver disease: None Platelets disease: None Wisdom teeth extraction- no increased bleeding Other surgeries: Left knee- 2007; Shoulder 2011- s/p 2 units of PRBC transfusion.  Family History of bleeding: none Medications: prednisone/ Asprin/ Fish oil/ NOAKs: none currently.   MEDICAL HISTORY:  Past Medical History:  Diagnosis Date   Chronic systolic heart failure (HCC)    a. 02/2014 LV gram: EF 40%, mod antlat HK, sev apical HK; b. 03/2017 MV: EF 57%; c. 11/2018 Echo: EF 45-50%, impaired relaxation. Nl RV size/fxn. Mildly dil LA. Mod elev PASP. Asc Ao 36mm.   Coronary artery disease    a. 02/2014 NSTEMI/PCI: LM nl, LAD 44m (2.5x33 Xience Alpine DES), LCX 50d, OM1/2 nl, RCA nl, RPDA nl, RPL1 nl, EF 40%; b. 03/2017 MV: EF 57%. Fixed apical and apical septal defect - MI vs attenuation. No  ischemia.   Diabetes mellitus without complication (HCC)    Hyperlipidemia    a. Statin intolerant; b. Could not afford Zetia.   Hypertension    Hypothyroidism    Ischemic cardiomyopathy    a. 02/2014 LV gram: EF 40%, mod antlat HK, sev apical HK.   LBBB (left bundle branch block)    MI (myocardial infarction) (HCC) 02/2014    SURGICAL HISTORY: Past Surgical History:  Procedure Laterality Date   CARDIAC CATHETERIZATION  03/16/2014   x1 stent   CARPAL TUNNEL RELEASE     MOHS SURGERY     SHOULDER SURGERY     TONSILLECTOMY     TOTAL ABDOMINAL HYSTERECTOMY     TOTAL KNEE ARTHROPLASTY Left     SOCIAL HISTORY: Social History   Socioeconomic History   Marital status: Widowed    Spouse name: Not on file   Number of children: Not on file   Years of education: Not on file   Highest education level: Not on file  Occupational History   Not on file  Tobacco Use   Smoking status: Never   Smokeless tobacco: Never  Vaping Use   Vaping status: Never Used  Substance and Sexual Activity   Alcohol use: No   Drug use: No   Sexual activity: Not on file  Other Topics Concern   Not on file  Social History Narrative   Not on file   Social Determinants of Health   Financial Resource Strain: Not on file  Food Insecurity: Not on file  Transportation Needs: Not on file  Physical Activity: Not on file  Stress: Not on file  Social Connections: Not on file  Intimate Partner Violence: Not on file    FAMILY HISTORY: Family History  Problem Relation Age of Onset   Heart attack Father 64   Heart attack Brother 90   Cancer Maternal Grandmother    Skin cancer Other     ALLERGIES:  is allergic to amlodipine, norvasc [amlodipine besylate], statins, sulfa antibiotics, tape, and codeine.  MEDICATIONS:  Current Outpatient Medications  Medication Sig Dispense Refill   albuterol (PROVENTIL HFA;VENTOLIN HFA) 108 (90 BASE) MCG/ACT inhaler Inhale 2 puffs into the lungs every 6 (six) hours  as needed for wheezing or shortness of breath. 1 Inhaler 0   b complex vitamins capsule Take 1 capsule by mouth daily.     carvedilol (COREG) 3.125 MG tablet TAKE (1) TABLET BY MOUTH TWICE DAILY WITH A MEAL 180 tablet 3   diazepam (VALIUM) 5 MG tablet Take 5 mg by mouth daily as needed.     gabapentin (NEURONTIN) 100 MG capsule Take 200 mg by mouth at bedtime.     levothyroxine (SYNTHROID, LEVOTHROID) 100 MCG tablet Take 100 mcg by mouth daily.     lisinopril (ZESTRIL) 10 MG tablet Take 1 tablet (10 mg total) by mouth daily. PLEASE CALL OFFICE TO SCHEDULE APPOINTMENT PRIOR TO NEXT REFILL 90 tablet 0   melatonin 5 MG TABS Take 0.5 mg by mouth at bedtime.     nitroGLYCERIN (NITROSTAT) 0.4 MG SL tablet Place 0.4 mg under the tongue every 5 (five) minutes as needed for chest pain.     ondansetron (ZOFRAN-ODT) 4 MG disintegrating tablet Take 1 tablet (4 mg total) by mouth every 8 (eight) hours as needed for nausea or vomiting. 12 tablet 0   valACYclovir (VALTREX) 1000 MG tablet Take 4,000 mg by mouth once.     No current facility-administered medications for this visit.   PHYSICAL EXAMINATION:  Vitals:   12/04/22 1411  BP: (!) 157/78  Pulse: 71  Temp: 98.9 F (37.2 C)  SpO2: 98%   Filed Weights   12/04/22 1411  Weight: 132 lb (59.9 kg)   Right hand- swollen.   Physical Exam Vitals and nursing note reviewed.  HENT:     Head: Normocephalic and atraumatic.     Mouth/Throat:     Pharynx: Oropharynx is clear.  Eyes:     Extraocular Movements: Extraocular movements intact.     Pupils: Pupils are equal, round, and reactive to light.  Cardiovascular:     Rate and Rhythm: Normal rate and regular rhythm.  Pulmonary:     Comments: Decreased breath sounds bilaterally.  Abdominal:     Palpations: Abdomen is soft.  Musculoskeletal:        General: Normal range of motion.     Cervical back: Normal range of motion.  Skin:    General: Skin is warm.  Neurological:     General: No focal  deficit present.     Mental Status: She is alert and oriented to person, place, and time.  Psychiatric:        Behavior: Behavior normal.        Judgment: Judgment normal.     LABORATORY DATA:  I have reviewed the data as listed Lab Results  Component Value Date   WBC 7.1 11/16/2022   HGB 12.6 11/16/2022   HCT 36.0 11/16/2022   MCV 89.6 11/16/2022   PLT 269 11/16/2022   Recent Labs    11/16/22 0745  NA 131*  K 4.4  CL 99  CO2 21*  GLUCOSE 153*  BUN 16  CREATININE 0.71  CALCIUM 9.1  GFRNONAA >60  PROT 6.4*  ALBUMIN 4.0  AST 24  ALT 14  ALKPHOS 54  BILITOT 1.0     No results found.  Abnormal bruising # Increased Bleeding s/p Moh's surgery [right wrist]-workup with PCP-August 2024 shows labs PT 11 PTT 33; factor VIII activity 41 slightly low [normal 56-140]; fibrinogen 370 normal, hemoglobin 12.3 platelets 290 LFTs normal creatinine 0.75 normal.   # I discussed with the patient and daughter the multiple etiologies of easy bruising/bleeding-include platelet dysfunction-quantitative/qualitative vs-coagulopathy involving various coagulation factors from liver disease/autoimmune phenomena.  No clinical evidence of DIC or any acute leukemic process related to increased bruising.    # Common benign process including senile purpura/possible infection/recent surgery /use of ibuprofen -could have compounded bleeding process.   I clinically doubt if the slightly low Factor VIII activity contributed to her bleeding.  # However I recommend checking CBC CMP; peripheral smear; PT PTT.  Consider inhibitor panel/mixing studies-if PT PTT is abnormal.  Repeat Factor VIII levels.  Thank you Dr.William for allowing me to participate in the care of your pleasant patient. Please do not hesitate to contact me with questions or concerns in the interim.  DISPOSITION: # labs today # follow up in 2 weeks- MD; virtual- NO labs- Dr.B     All questions were answered. The patient knows to  call the clinic with any problems, questions or concerns.      Earna Coder, MD 12/04/2022 3:04 PM

## 2022-12-04 NOTE — Progress Notes (Signed)
Patient feels like she is getting a little bit stronger Monday 2 weeks ago, since her procedure, she started bleeding small amount throughout the night. She is feeling fatigued. Her right hand is real swollen.

## 2022-12-04 NOTE — Assessment & Plan Note (Addendum)
#   Increased Bleeding s/p Moh's surgery [right wrist]-workup with PCP-August 2024 shows labs PT 11 PTT 33; factor VIII activity 41 slightly low [normal 56-140]; fibrinogen 370 normal, hemoglobin 12.3 platelets 290 LFTs normal creatinine 0.75 normal.   # I discussed with the patient and daughter the multiple etiologies of easy bruising/bleeding-include platelet dysfunction-quantitative/qualitative vs-coagulopathy involving various coagulation factors from liver disease/autoimmune phenomena.  No clinical evidence of DIC or any acute leukemic process related to increased bruising.    # Common benign process including senile purpura/possible infection/recent surgery /use of ibuprofen -could have compounded bleeding process.   I clinically doubt if the slightly low Factor VIII activity contributed to her bleeding.  # However I recommend checking CBC CMP; peripheral smear; PT PTT.  Consider inhibitor panel/mixing studies-if PT PTT is abnormal.  Repeat Factor VIII levels.  Thank you Dr.William for allowing me to participate in the care of your pleasant patient. Please do not hesitate to contact me with questions or concerns in the interim.  DISPOSITION: # labs today # follow up in 2 weeks- MD; virtual- NO labs- Dr.B

## 2022-12-06 LAB — FACTOR 8 ASSAY: Coagulation Factor VIII: 50 % — ABNORMAL LOW (ref 56–140)

## 2022-12-08 ENCOUNTER — Ambulatory Visit: Payer: Medicare Other | Admitting: Physician Assistant

## 2022-12-18 ENCOUNTER — Inpatient Hospital Stay (HOSPITAL_BASED_OUTPATIENT_CLINIC_OR_DEPARTMENT_OTHER): Payer: Medicare Other | Admitting: Internal Medicine

## 2022-12-18 DIAGNOSIS — R233 Spontaneous ecchymoses: Secondary | ICD-10-CM | POA: Diagnosis not present

## 2022-12-18 NOTE — Assessment & Plan Note (Addendum)
#   Increased Bleeding s/p Moh's surgery [right wrist]-workup with PCP-August 2024 shows labs PT 11 PTT 33; factor VIII activity 41 slightly low [normal 56-140]; fibrinogen 370 normal, hemoglobin 12.3 platelets 290 LFTs normal creatinine 0.75 normal.  REPEAT FACTOR VIII-activity was slightly low than normal.  However I would not attribute this slightly low Factor VIII activity to cause her profuse bleeding.  # I am not concerned about acquired hemophilia either-as the PT PTT was normal.  I would not recommend any further workup at this time.  # I suspect patient bleeding episode was multifactorial- senile purpura/possible infection/recent surgery /use of ibuprofen -could have compounded bleeding process.   I clinically doubt if the slightly low Factor VIII activity contributed to her bleeding.  I would not recommend further workup at this time.  DISPOSITION: # follow up as needed-  Dr.B

## 2022-12-18 NOTE — Progress Notes (Signed)
I connected with Kendra Prince on 12/18/22 at  3:15 PM EDT by video enabled telemedicine visit and verified that I am speaking with the correct person using two identifiers.  I discussed the limitations, risks, security and privacy concerns of performing an evaluation and management service by telemedicine and the availability of in-person appointments. I also discussed with the patient that there may be a patient responsible charge related to this service. The patient expressed understanding and agreed to proceed.    Other persons participating in the visit and their role in the encounter: RN/medical reconciliation Patient's location: home Provider's location: office  Oncology History   No history exists.     Chief Complaint: easy bruising.     History of present illness:Kendra Prince 89 y.o.  female with recent episode of profuse bleeding after her Mohs surgery on the wrist is here for follow-up.  Patient denies any further bleeding episodes.  Denies any gum bleeding nosebleeds.  States her wrist is healing well postsurgery.  Observation/objective: Alert & oriented x 3. In No acute distress.   Assessment and plan: Abnormal bruising # Increased Bleeding s/p Moh's surgery [right wrist]-workup with PCP-August 2024 shows labs PT 11 PTT 33; factor VIII activity 41 slightly low [normal 56-140]; fibrinogen 370 normal, hemoglobin 12.3 platelets 290 LFTs normal creatinine 0.75 normal.  REPEAT FACTOR VIII-activity was slightly low than normal.  However I would not attribute this slightly low Factor VIII activity to cause her profuse bleeding.  # I am not concerned about acquired hemophilia either-as the PT PTT was normal.  I would not recommend any further workup at this time.  # I suspect patient bleeding episode was multifactorial- senile purpura/possible infection/recent surgery /use of ibuprofen -could have compounded bleeding process.   I clinically doubt if the slightly low Factor  VIII activity contributed to her bleeding.  I would not recommend further workup at this time.  DISPOSITION: # follow up as needed-  Dr.B  Follow-up instructions:  I discussed the assessment and treatment plan with the patient.  The patient was provided an opportunity to ask questions and all were answered.  The patient agreed with the plan and demonstrated understanding of instructions.  The patient was advised to call back or seek an in person evaluation if the symptoms worsen or if the condition fails to improve as anticipated.    Dr. Louretta Shorten [facility] 12/28/2022 3:00 PM

## 2022-12-18 NOTE — Progress Notes (Signed)
Patient says that her right hand is still swollen and her sodium is still low. She seen her surgeon and they believe that what is going on with the patient is an autoimmune disorder.

## 2022-12-21 NOTE — Progress Notes (Unsigned)
Cardiology Office Note    Date:  12/22/2022   ID:  Kendra Prince [birth year], MRN [id number]  PCP:  Bary Leriche, MD  Cardiologist:  Lorine Bears, MD  Electrophysiologist:  None   Chief Complaint: Follow-up  History of Present Illness:   Kendra Prince is a 89 y.o. female with history of CAD with NSTEMI in 02/2014 status post PCI/DES to the mid LAD, HFrEF secondary to ICM, DM2, HTN, HLD with statin intolerance, LBBB, and hypothyroidism who presents for follow-up of CAD.  She was admitted to the hospital in 02/2014 with an NSTEMI with anterior ischemic changes on EKG.  LHC showed 99% mid LAD stenosis treated successfully with PCI/DES.  EF of 40%.  Most recent ischemic evaluation in 03/2017 via Lexiscan MPI which showed no evidence of ischemia with a normal EF of 57%.  Echo in 11/2018 showed an EF of 45 to 50% with moderate pulmonary hypertension with an RVSP of 46 mmHg.  Most recent echo from 01/2022 showed an EF of 50 to 55% with mild pulmonary hypertension.  She was last seen in the office in 06/2022 and was without symptoms of angina or cardiac decompensation.  She underwent Mohs surgery of her hand in 10/2022.  Following this, she presented to the ER for evaluation of bleeding in her hand and associated hypotension with lightheadedness felt to be vagal in etiology.  Hgb trended from 13.1 to 12.6 in the ER.  Has subsequently been evaluated by hematology.  She comes in accompanied by her daughter today and is without symptoms of angina or cardiac decompensation.  No further bleeding.  Swelling along the right hand is improving.  Blood pressures at home have ranged from the 90s to 1-teens predominantly with an occasional reading 130s systolic.  Currently taking carvedilol half of a 3.125 mg twice daily and lisinopril 10 mg daily.  No presyncope or syncope.  Has not been taking aspirin.  Also notes some difficulty concentrating at times when working on Sudoku.  No pulsatile  tinnitus.   Labs independently reviewed: 11/2022 - BUN 12, serum creatinine 0.61, potassium 4.4, albumin 4.5, AST/ALT normal, TSH normal 10/2022 - Hgb 12.3, PLT 290 06/2022 - TC 234, TG 181, HDL 48, LDL 153   Past Medical History:  Diagnosis Date   Chronic systolic heart failure (HCC)    a. 02/2014 LV gram: EF 40%, mod antlat HK, sev apical HK; b. 03/2017 MV: EF 57%; c. 11/2018 Echo: EF 45-50%, impaired relaxation. Nl RV size/fxn. Mildly dil LA. Mod elev PASP. Asc Ao 36mm.   Coronary artery disease    a. 02/2014 NSTEMI/PCI: LM nl, LAD 42m (2.5x33 Xience Alpine DES), LCX 50d, OM1/2 nl, RCA nl, RPDA nl, RPL1 nl, EF 40%; b. 03/2017 MV: EF 57%. Fixed apical and apical septal defect - MI vs attenuation. No ischemia.   Diabetes mellitus without complication (HCC)    Hyperlipidemia    a. Statin intolerant; b. Could not afford Zetia.   Hypertension    Hypothyroidism    Ischemic cardiomyopathy    a. 02/2014 LV gram: EF 40%, mod antlat HK, sev apical HK.   LBBB (left bundle branch block)    MI (myocardial infarction) (HCC) 02/2014    Past Surgical History:  Procedure Laterality Date   CARDIAC CATHETERIZATION  03/16/2014   x1 stent   CARPAL TUNNEL RELEASE     MOHS SURGERY     SHOULDER SURGERY     TONSILLECTOMY     TOTAL ABDOMINAL  HYSTERECTOMY     TOTAL KNEE ARTHROPLASTY Left     Current Medications: Current Meds  Medication Sig   albuterol (PROVENTIL HFA;VENTOLIN HFA) 108 (90 BASE) MCG/ACT inhaler Inhale 2 puffs into the lungs every 6 (six) hours as needed for wheezing or shortness of breath.   aspirin EC 81 MG tablet Take 1 tablet (81 mg total) by mouth daily. Swallow whole.   b complex vitamins capsule Take 1 capsule by mouth daily.   carvedilol (COREG) 3.125 MG tablet TAKE (1) TABLET BY MOUTH TWICE DAILY WITH A MEAL   diazepam (VALIUM) 5 MG tablet Take 5 mg by mouth daily as needed.   gabapentin (NEURONTIN) 100 MG capsule Take 200 mg by mouth at bedtime.   levothyroxine (SYNTHROID,  LEVOTHROID) 100 MCG tablet Take 100 mcg by mouth daily.   lisinopril (ZESTRIL) 10 MG tablet Take 1 tablet (10 mg total) by mouth daily. PLEASE CALL OFFICE TO SCHEDULE APPOINTMENT PRIOR TO NEXT REFILL   melatonin 5 MG TABS Take 0.5 mg by mouth at bedtime.   nitroGLYCERIN (NITROSTAT) 0.4 MG SL tablet Place 0.4 mg under the tongue every 5 (five) minutes as needed for chest pain.   ondansetron (ZOFRAN-ODT) 4 MG disintegrating tablet Take 1 tablet (4 mg total) by mouth every 8 (eight) hours as needed for nausea or vomiting.   valACYclovir (VALTREX) 1000 MG tablet Take 4,000 mg by mouth once.    Allergies:   Amlodipine, Norvasc [amlodipine besylate], Statins, Sulfa antibiotics, Tape, and Codeine   Social History   Socioeconomic History   Marital status: Widowed    Spouse name: Not on file   Number of children: Not on file   Years of education: Not on file   Highest education level: Not on file  Occupational History   Not on file  Tobacco Use   Smoking status: Never   Smokeless tobacco: Never  Vaping Use   Vaping status: Never Used  Substance and Sexual Activity   Alcohol use: No   Drug use: No   Sexual activity: Not on file  Other Topics Concern   Not on file  Social History Narrative   Not on file   Social Determinants of Health   Financial Resource Strain: Not on file  Food Insecurity: Not on file  Transportation Needs: Not on file  Physical Activity: Not on file  Stress: Not on file  Social Connections: Not on file     Family History:  The patient's family history includes Cancer in her maternal grandmother; Heart attack (age of onset: 66) in her brother; Heart attack (age of onset: 29) in her father; Skin cancer in an other family member.  ROS:   12-point review of systems is negative unless otherwise noted in the HPI.   EKGs/Labs/Other Studies Reviewed:    Studies reviewed were summarized above. The additional studies were reviewed today:  2D echo 02/17/2022: 1.  Left ventricular ejection fraction, by estimation, is 50 to 55%. The  left ventricle has low normal function. The left ventricle has no regional  wall motion abnormalities. There is mild left ventricular hypertrophy of  the basal-septal segment. Left  ventricular diastolic parameters are consistent with Grade I diastolic  dysfunction (impaired relaxation). The average left ventricular global  longitudinal strain is -12.5 %.   2. Right ventricular systolic function is normal. The right ventricular  size is normal. There is mildly elevated pulmonary artery systolic  pressure. The estimated right ventricular systolic pressure is 39.6 mmHg.   3.  The mitral valve is normal in structure. No evidence of mitral valve  regurgitation. No evidence of mitral stenosis.   4. The aortic valve is tricuspid. Aortic valve regurgitation is mild.  Aortic valve sclerosis is present, with no evidence of aortic valve  stenosis.   5. There is borderline dilatation of the ascending aorta, measuring 39  mm.   6. The inferior vena cava is normal in size with greater than 50%  respiratory variability, suggesting right atrial pressure of 3 mmHg.   Comparison(s): 12/22/18-EF 45-50%;  __________  2D echo 12/22/2018: 1. Left ventricular ejection fraction, by visual estimation, is 45 to  50%. The left ventricle has normal function. Normal left ventricular size.  There is mildly increased left ventricular hypertrophy.Unable to exclude  septal wall hypokinesis.   2. Left ventricular diastolic Doppler parameters are consistent with  impaired relaxation pattern of LV diastolic filling.   3. Global right ventricle has normal systolic function.The right  ventricular size is normal. No increase in right ventricular wall  thickness.   4. Moderately elevated pulmonary artery systolic pressure.   5. Left atrial size was mildly dilated.   6. There is very mild dilatation of the ascending aorta measuring 36 mm.   __________  Eugenie Birks MPI 04/14/2017: Pharmacological myocardial perfusion imaging study with no significant ischemia Small region of mild fixed apical and apical septal perfusion defect, consistent with previous MI vs attenuation artifact Normal wall motion, EF estimated at 57% No EKG changes concerning for ischemia at peak stress or in recovery. Resting EKG with IVCD Low risk scan __________  LHC 03/16/2014: Mid LAD 99% stenosis status post PCI/DES, distal CX 50% stenosis   EKG:  EKG is ordered today.  The EKG ordered today demonstrates NSR, 69 bpm, first-degree AV block, LBBB, unchanged from prior tracing  Recent Labs: 11/16/2022: ALT 14; BUN 16; Creatinine, Ser 0.71; Hemoglobin 12.6; Platelets 269; Potassium 4.4; Sodium 131  Recent Lipid Panel    Component Value Date/Time   CHOL 200 (H) 09/20/2015 0928   CHOL 145 03/16/2014 0538   TRIG 156 (H) 09/20/2015 0928   TRIG 173 03/16/2014 0538   HDL 44 09/20/2015 0928   HDL 40 03/16/2014 0538   CHOLHDL 4.5 (H) 09/20/2015 0928   VLDL 35 03/16/2014 0538   LDLCALC 125 (H) 09/20/2015 0928   LDLCALC 70 03/16/2014 0538    PHYSICAL EXAM:    VS:  BP (!) 178/84 (BP Location: Left Arm, Patient Position: Sitting, Cuff Size: Normal)   Pulse 69   Ht 4\' 11"  (1.499 m)   Wt 136 lb 12.8 oz (62.1 kg)   SpO2 98%   BMI 27.63 kg/m   BMI: Body mass index is 27.63 kg/m.  Physical Exam Vitals reviewed.  Constitutional:      Appearance: She is well-developed.  HENT:     Head: Normocephalic and atraumatic.  Eyes:     General:        Right eye: No discharge.        Left eye: No discharge.  Neck:     Vascular: No JVD.  Cardiovascular:     Rate and Rhythm: Normal rate and regular rhythm.     Heart sounds: S1 normal and S2 normal. Heart sounds not distant. No midsystolic click and no opening snap. Murmur heard.     Systolic murmur is present with a grade of 1/6 at the upper right sternal border.     No friction rub.  Pulmonary:      Effort:  Pulmonary effort is normal. No respiratory distress.     Breath sounds: Normal breath sounds. No decreased breath sounds, wheezing, rhonchi or rales.  Chest:     Chest wall: No tenderness.  Abdominal:     General: There is no distension.  Musculoskeletal:     Cervical back: Normal range of motion.     Right lower leg: No edema.     Left lower leg: No edema.     Comments: Clean, dry and intact dressing on the right hand.  Skin:    General: Skin is warm and dry.     Nails: There is no clubbing.  Neurological:     Mental Status: She is alert and oriented to person, place, and time.  Psychiatric:        Speech: Speech normal.        Behavior: Behavior normal.        Thought Content: Thought content normal.        Judgment: Judgment normal.     Wt Readings from Last 3 Encounters:  12/22/22 136 lb 12.8 oz (62.1 kg)  12/04/22 132 lb (59.9 kg)  11/16/22 132 lb (59.9 kg)     ASSESSMENT & PLAN:   CAD involving the native coronary arteries without angina: She is without symptoms of angina or cardiac decompensation.  Recommend she restart enteric-coated aspirin 81 mg daily.  With prior history of bleeding from a Mohs surgical site, we will check a CBC 1 week after reinitiating aspirin and again possibly 2 weeks thereafter.  No indication for further ischemic testing at this time.  ICM: Most recent EF low normal at 50 to 55%.  Euvolemic and well compensated to possibly volume depleted.  Remains on low-dose carvedilol and lisinopril as outlined below.  Not currently requiring standing loop diuretic.  Obtain echo to evaluate for progressive cardiomyopathy.  HTN: Blood pressure is elevated in the office today though well-controlled at home and at times soft.  Remains on half of a carvedilol 3.125 mg twice daily and lisinopril 5 mg daily.  HLD: LDL 153 in 06/2022.  She is intolerant to all statins.  She did not tolerate ezetimibe secondary to her GI symptoms.  Continue with heartily  diet.  Dizziness: Obtain carotid artery ultrasound.    Disposition: F/u with Dr. Kirke Corin or an APP in 2 months.   Medication Adjustments/Labs and Tests Ordered: Current medicines are reviewed at length with the patient today.  Concerns regarding medicines are outlined above. Medication changes, Labs and Tests ordered today are summarized above and listed in the Patient Instructions accessible in Encounters.   Signed, Kendra Listen, PA-C 12/22/2022 12:14 PM     [facility] [place] [address] [place], [place] [address] [phone]

## 2022-12-22 ENCOUNTER — Ambulatory Visit: Payer: Medicare Other | Attending: Physician Assistant | Admitting: Physician Assistant

## 2022-12-22 ENCOUNTER — Encounter: Payer: Self-pay | Admitting: Physician Assistant

## 2022-12-22 VITALS — BP 178/84 | HR 69 | Ht 59.0 in | Wt 136.8 lb

## 2022-12-22 DIAGNOSIS — I519 Heart disease, unspecified: Secondary | ICD-10-CM | POA: Diagnosis not present

## 2022-12-22 DIAGNOSIS — I10 Essential (primary) hypertension: Secondary | ICD-10-CM

## 2022-12-22 DIAGNOSIS — I255 Ischemic cardiomyopathy: Secondary | ICD-10-CM | POA: Diagnosis present

## 2022-12-22 DIAGNOSIS — E785 Hyperlipidemia, unspecified: Secondary | ICD-10-CM

## 2022-12-22 DIAGNOSIS — I2510 Atherosclerotic heart disease of native coronary artery without angina pectoris: Secondary | ICD-10-CM | POA: Diagnosis not present

## 2022-12-22 DIAGNOSIS — R42 Dizziness and giddiness: Secondary | ICD-10-CM

## 2022-12-22 DIAGNOSIS — Z789 Other specified health status: Secondary | ICD-10-CM | POA: Diagnosis present

## 2022-12-22 MED ORDER — ASPIRIN 81 MG PO TBEC: 81.0000 mg | DELAYED_RELEASE_TABLET | Freq: Every day | ORAL | 3 refills | Status: AC

## 2022-12-22 NOTE — Patient Instructions (Signed)
Medication Instructions:  Your physician recommends the following medication changes.  START TAKING: Aspirin (enteric coated) 81 mg daily  *If you need a refill on your cardiac medications before your next appointment, please call your pharmacy*   Lab Work: Your provider would like for you to return in 1 week  to have the following labs drawn: CBC.   Please go to [facility] [address] ([facility] [address], [place] [address] You do not need an appointment.  They are open from 7:30 am-4 pm.  Lunch from 1:00 pm- 2:00 pm You not need to be fasting.   You may also go to any of these [facility] locations:  [employer]  - [address] [employer] [address] Chief Technology Officer)     If you have labs (blood work) drawn today and your tests are completely normal, you will receive your results only by: [employer] (if you have MyChart) OR A paper copy in the mail If you have any lab test that is abnormal or we need to change your treatment, we will call you to review the results.   Testing/Procedures: Your physician has requested that you have an echocardiogram. Echocardiography is a painless test that uses sound waves to create images of your heart. It provides your doctor with information about the size and shape of your heart and how well your heart's chambers and valves are working.   You may receive an ultrasound enhancing agent through an IV if needed to better visualize your heart during the echo. This procedure takes approximately one hour.  There are no restrictions for this procedure.  This will take place at [address] ([facility]) [address], [place] [address]   Your physician has requested that you have a carotid duplex. This test is an ultrasound of the carotid arteries in your neck. It looks at blood flow through these arteries that supply the brain with blood.   Allow one hour for this exam.  There are no restrictions or special  instructions.  This will take place at [address] ([facility]) [address], [place] [address]    Follow-Up: At [facility], you and your health needs are our priority.  As part of our continuing mission to provide you with exceptional heart care, we have created designated Provider Care Teams.  These Care Teams include your primary Cardiologist (physician) and Advanced Practice Providers (APPs -  Physician Assistants and Nurse Practitioners) who all work together to provide you with the care you need, when you need it.  We recommend signing up for the patient portal called "MyChart".  Sign up information is provided on this After Visit Summary.  MyChart is used to connect with patients for Virtual Visits (Telemedicine).  Patients are able to view lab/test results, encounter notes, upcoming appointments, etc.  Non-urgent messages can be sent to your provider as well.   To learn more about what you can do with MyChart, go to [email or web address].    Your next appointment:   2 month(s)  Provider:   You may see Lorine Bears, MD or one of the following Advanced Practice Providers on your designated Care Team:   Eula Listen, [place]

## 2023-01-04 ENCOUNTER — Ambulatory Visit: Payer: Medicare Other | Attending: Physician Assistant

## 2023-01-04 DIAGNOSIS — R42 Dizziness and giddiness: Secondary | ICD-10-CM | POA: Insufficient documentation

## 2023-01-12 ENCOUNTER — Ambulatory Visit: Payer: Medicare Other | Attending: Physician Assistant

## 2023-01-12 DIAGNOSIS — I519 Heart disease, unspecified: Secondary | ICD-10-CM | POA: Diagnosis present

## 2023-01-12 DIAGNOSIS — I2510 Atherosclerotic heart disease of native coronary artery without angina pectoris: Secondary | ICD-10-CM | POA: Insufficient documentation

## 2023-01-13 LAB — ECHOCARDIOGRAM COMPLETE
Area-P 1/2: 3.37 cm2
P 1/2 time: 484 ms
S' Lateral: 2.8 cm

## 2023-01-18 ENCOUNTER — Other Ambulatory Visit: Payer: Self-pay | Admitting: Cardiovascular Disease

## 2023-01-18 ENCOUNTER — Other Ambulatory Visit: Payer: Self-pay | Admitting: Cardiology

## 2023-02-15 ENCOUNTER — Encounter: Payer: Self-pay | Admitting: Physician Assistant

## 2023-02-15 ENCOUNTER — Ambulatory Visit: Payer: Medicare Other | Attending: Physician Assistant | Admitting: Physician Assistant

## 2023-02-15 VITALS — BP 124/86 | HR 68 | Ht 59.0 in | Wt 134.6 lb

## 2023-02-15 DIAGNOSIS — E871 Hypo-osmolality and hyponatremia: Secondary | ICD-10-CM | POA: Insufficient documentation

## 2023-02-15 DIAGNOSIS — I255 Ischemic cardiomyopathy: Secondary | ICD-10-CM | POA: Diagnosis present

## 2023-02-15 DIAGNOSIS — I10 Essential (primary) hypertension: Secondary | ICD-10-CM | POA: Diagnosis not present

## 2023-02-15 DIAGNOSIS — I2510 Atherosclerotic heart disease of native coronary artery without angina pectoris: Secondary | ICD-10-CM | POA: Insufficient documentation

## 2023-02-15 DIAGNOSIS — E785 Hyperlipidemia, unspecified: Secondary | ICD-10-CM | POA: Diagnosis not present

## 2023-02-15 DIAGNOSIS — Z789 Other specified health status: Secondary | ICD-10-CM | POA: Diagnosis present

## 2023-02-15 MED ORDER — LOSARTAN POTASSIUM 25 MG PO TABS
12.5000 mg | ORAL_TABLET | Freq: Every day | ORAL | 3 refills | Status: DC
Start: 2023-02-15 — End: 2023-07-27

## 2023-02-15 NOTE — Patient Instructions (Signed)
Medication Instructions:  Your physician recommends the following medication changes.  STOP TAKING: Lisinopril   START TAKING: Losartan 12.5 mg daily   *If you need a refill on your cardiac medications before your next appointment, please call your pharmacy*   Lab Work: Your provider would like for you to have following labs drawn today BMET.   If you have labs (blood work) drawn today and your tests are completely normal, you will receive your results only by: MyChart Message (if you have MyChart) OR A paper copy in the mail If you have any lab test that is abnormal or we need to change your treatment, we will call you to review the results.   Follow-Up: At [facility], you and your health needs are our priority.  As part of our continuing mission to provide you with exceptional heart care, we have created designated Provider Care Teams.  These Care Teams include your primary Cardiologist (physician) and Advanced Practice Providers (APPs -  Physician Assistants and Nurse Practitioners) who all work together to provide you with the care you need, when you need it.  We recommend signing up for the patient portal called "MyChart".  Sign up information is provided on this After Visit Summary.  MyChart is used to connect with patients for Virtual Visits (Telemedicine).  Patients are able to view lab/test results, encounter notes, upcoming appointments, etc.  Non-urgent messages can be sent to your provider as well.   To learn more about what you can do with MyChart, go to [email or web address].    Your next appointment:   2 month(s)  Provider:   You may see Lorine Bears, MD or one of the following Advanced Practice Providers on your designated Care Team:   Eula Listen, [place]

## 2023-02-15 NOTE — Progress Notes (Signed)
Cardiology Office Note    Date:  02/15/2023   ID:  Kendra Prince, DOB 1932/03/13, MRN [id number]  PCP:  Bary Leriche, MD  Cardiologist:  Lorine Bears, MD  Electrophysiologist:  None   Chief Complaint: Follow up  History of Present Illness:   Kendra Prince is a 89 y.o. female with history of CAD with NSTEMI in 02/2014 status post PCI/DES to the mid LAD, HFrEF secondary to ICM, DM2, HTN, HLD with statin intolerance, LBBB, and hypothyroidism who presents for follow-up of echo.   She was admitted to the hospital in 02/2014 with an NSTEMI with anterior ischemic changes on EKG.  LHC showed 99% mid LAD stenosis treated successfully with PCI/DES.  EF of 40%.  Most recent ischemic evaluation in 03/2017 via Lexiscan MPI which showed no evidence of ischemia with a normal EF of 57%.  Echo in 11/2018 showed an EF of 45 to 50% with moderate pulmonary hypertension with an RVSP of 46 mmHg.  Echo from 01/2022 showed an EF of 50 to 55% with mild pulmonary hypertension.     She underwent Mohs surgery of her hand in 10/2022.  Following this, she presented to the ER for evaluation of bleeding in her hand and associated hypotension with lightheadedness felt to be vagal in etiology.  Hgb trended from 13.1 to 12.6 in the ER.  Has subsequently been evaluated by hematology.  She was last seen in the office on 12/22/2022 and was without symptoms of angina or cardiac decompensation.  She was without further bleeding episodes.  Swelling along the right hand was improving.  Blood pressure at home range from the 90s to 1 teens systolic with an occasional reading in the 130s millimeter mercury systolic.  She was taking a half of a carvedilol 3.125 mg twice daily and lisinopril 10 mg.  She had not been taking aspirin.  She underwent echo on 01/16/2023 that showed an EF of 55 to 60%, no regional wall motion abnormalities, grade 1 diastolic dysfunction, normal RV systolic function and ventricular cavity size,  mildly elevated RVSP estimated at 42.2 mmHg, mildly dilated left atrium, mild mitral regurgitation, mild to moderate tricuspid regurgitation, aortic valve insufficiency with aortic valve sclerosis without evidence of stenosis, and an estimated right atrial pressure of 3 mmHg.  Carotid artery ultrasound in 12/2022 showed 1 to 39% right ICA stenosis with no evidence of stenosis in the left ICA, antegrade flow of the bilateral vertebral arteries, and normal flow hemodynamics of the bilateral subclavian arteries.  She comes in accompanied by her daughter today and is without symptoms of angina or cardiac decompensation.  Overall, blood pressures have been reasonably controlled at home, currently taking half tab of carvedilol 3.125 mg twice daily and lisinopril 5 mg at lunchtime.  She does note some mild elevation in her blood pressure in the morning prior to taking her carvedilol.  Drinking Liquid IV 2 to 3 days/week.  No presyncope or syncope.  Back on aspirin.  Longstanding history of cough.  Overall, patient and daughter feel like the patient is doing quite well.   Labs independently reviewed: 11/2022 - BUN 12, serum creatinine 0.61, potassium 4.4, albumin 4.5, AST/ALT normal, TSH normal 10/2022 - Hgb 12.3, PLT 290 06/2022 - TC 234, TG 181, HDL 48, LDL 153  Past Medical History:  Diagnosis Date   Chronic systolic heart failure (HCC)    a. 02/2014 LV gram: EF 40%, mod antlat HK, sev apical HK; b. 03/2017 MV: EF 57%; c.  11/2018 Echo: EF 45-50%, impaired relaxation. Nl RV size/fxn. Mildly dil LA. Mod elev PASP. Asc Ao 36mm.   Coronary artery disease    a. 02/2014 NSTEMI/PCI: LM nl, LAD 40m (2.5x33 Xience Alpine DES), LCX 50d, OM1/2 nl, RCA nl, RPDA nl, RPL1 nl, EF 40%; b. 03/2017 MV: EF 57%. Fixed apical and apical septal defect - MI vs attenuation. No ischemia.   Diabetes mellitus without complication (HCC)    Hyperlipidemia    a. Statin intolerant; b. Could not afford Zetia.   Hypertension     Hypothyroidism    Ischemic cardiomyopathy    a. 02/2014 LV gram: EF 40%, mod antlat HK, sev apical HK.   LBBB (left bundle branch block)    MI (myocardial infarction) (HCC) 02/2014    Past Surgical History:  Procedure Laterality Date   CARDIAC CATHETERIZATION  03/16/2014   x1 stent   CARPAL TUNNEL RELEASE     MOHS SURGERY     SHOULDER SURGERY     TONSILLECTOMY     TOTAL ABDOMINAL HYSTERECTOMY     TOTAL KNEE ARTHROPLASTY Left     Current Medications: Current Meds  Medication Sig   albuterol (PROVENTIL HFA;VENTOLIN HFA) 108 (90 BASE) MCG/ACT inhaler Inhale 2 puffs into the lungs every 6 (six) hours as needed for wheezing or shortness of breath.   aspirin EC 81 MG tablet Take 1 tablet (81 mg total) by mouth daily. Swallow whole.   b complex vitamins capsule Take 1 capsule by mouth daily.   carvedilol (COREG) 3.125 MG tablet TAKE (1) TABLET BY MOUTH TWICE DAILY WITH A MEAL (Patient taking differently: 1/2 tab twice daily)   diazepam (VALIUM) 5 MG tablet Take 5 mg by mouth daily as needed. prn   gabapentin (NEURONTIN) 100 MG capsule Take 200 mg by mouth at bedtime.   levothyroxine (SYNTHROID, LEVOTHROID) 100 MCG tablet Take 100 mcg by mouth daily.   losartan (COZAAR) 25 MG tablet Take 0.5 tablets (12.5 mg total) by mouth daily.   melatonin 5 MG TABS Take 2 mg by mouth at bedtime.   nitroGLYCERIN (NITROSTAT) 0.4 MG SL tablet Place 0.4 mg under the tongue every 5 (five) minutes as needed for chest pain.   ondansetron (ZOFRAN-ODT) 4 MG disintegrating tablet Take 1 tablet (4 mg total) by mouth every 8 (eight) hours as needed for nausea or vomiting.   valACYclovir (VALTREX) 1000 MG tablet Take 4,000 mg by mouth once.   [DISCONTINUED] lisinopril (ZESTRIL) 10 MG tablet Take 1 tablet (10 mg total) by mouth daily. (Patient taking differently: Take 5 mg by mouth daily.)   [DISCONTINUED] lisinopril (ZESTRIL) 5 MG tablet Take 5 mg by mouth daily.    Allergies:   Amlodipine, Norvasc [amlodipine  besylate], Statins, Sulfa antibiotics, Tape, and Codeine   Social History   Socioeconomic History   Marital status: Widowed    Spouse name: Not on file   Number of children: Not on file   Years of education: Not on file   Highest education level: Not on file  Occupational History   Not on file  Tobacco Use   Smoking status: Never   Smokeless tobacco: Never  Vaping Use   Vaping status: Never Used  Substance and Sexual Activity   Alcohol use: No   Drug use: No   Sexual activity: Not on file  Other Topics Concern   Not on file  Social History Narrative   Not on file   Social Determinants of Health   Financial Resource Strain:  Not on file  Food Insecurity: Not on file  Transportation Needs: Not on file  Physical Activity: Not on file  Stress: Not on file  Social Connections: Not on file     Family History:  The patient's family history includes Cancer in her maternal grandmother; Heart attack (age of onset: 65) in her brother; Heart attack (age of onset: 53) in her father; Skin cancer in an other family member.  ROS:   12-point review of systems is negative unless otherwise noted in the HPI.   EKGs/Labs/Other Studies Reviewed:    Studies reviewed were summarized above. The additional studies were reviewed today:  2D echo 01/12/2023: 1. Left ventricular ejection fraction, by estimation, is 55 to 60%. Left  ventricular ejection fraction by 3D volume is 58 %. The left ventricle has  normal function. The left ventricle has no regional wall motion  abnormalities. Left ventricular diastolic   parameters are consistent with Grade I diastolic dysfunction (impaired  relaxation). The average left ventricular global longitudinal strain is  -18.2 %.   2. Right ventricular systolic function is normal. The right ventricular  size is normal. There is mildly elevated pulmonary artery systolic  pressure.   3. Left atrial size was mildly dilated.   4. The mitral valve is normal  in structure. Mild mitral valve  regurgitation. No evidence of mitral stenosis.   5. Tricuspid valve regurgitation is mild to moderate.   6. The aortic valve is tricuspid. Aortic valve regurgitation is moderate.  Aortic valve sclerosis is present, with no evidence of aortic valve  stenosis.   7. The inferior vena cava is normal in size with greater than 50%  respiratory variability, suggesting right atrial pressure of 3 mmHg.  __________  Carotid artery ultrasound 01/04/2023: Summary:  Right Carotid: Velocities in the right ICA are consistent with a 1-39% stenosis.   Left Carotid: There is no evidence of stenosis in the left ICA.   Vertebrals:  Bilateral vertebral arteries demonstrate antegrade flow.  Subclavians: Normal flow hemodynamics were seen in bilateral subclavian arteries.  __________  2D echo 02/17/2022: 1. Left ventricular ejection fraction, by estimation, is 50 to 55%. The  left ventricle has low normal function. The left ventricle has no regional  wall motion abnormalities. There is mild left ventricular hypertrophy of  the basal-septal segment. Left  ventricular diastolic parameters are consistent with Grade I diastolic  dysfunction (impaired relaxation). The average left ventricular global  longitudinal strain is -12.5 %.   2. Right ventricular systolic function is normal. The right ventricular  size is normal. There is mildly elevated pulmonary artery systolic  pressure. The estimated right ventricular systolic pressure is 39.6 mmHg.   3. The mitral valve is normal in structure. No evidence of mitral valve  regurgitation. No evidence of mitral stenosis.   4. The aortic valve is tricuspid. Aortic valve regurgitation is mild.  Aortic valve sclerosis is present, with no evidence of aortic valve  stenosis.   5. There is borderline dilatation of the ascending aorta, measuring 39  mm.   6. The inferior vena cava is normal in size with greater than 50%  respiratory  variability, suggesting right atrial pressure of 3 mmHg.   Comparison(s): 12/22/18-EF 45-50%;  __________   2D echo 12/22/2018: 1. Left ventricular ejection fraction, by visual estimation, is 45 to  50%. The left ventricle has normal function. Normal left ventricular size.  There is mildly increased left ventricular hypertrophy.Unable to exclude  septal wall hypokinesis.  2. Left ventricular diastolic Doppler parameters are consistent with  impaired relaxation pattern of LV diastolic filling.   3. Global right ventricle has normal systolic function.The right  ventricular size is normal. No increase in right ventricular wall  thickness.   4. Moderately elevated pulmonary artery systolic pressure.   5. Left atrial size was mildly dilated.   6. There is very mild dilatation of the ascending aorta measuring 36 mm.  __________   Eugenie Birks MPI 04/14/2017: Pharmacological myocardial perfusion imaging study with no significant ischemia Small region of mild fixed apical and apical septal perfusion defect, consistent with previous MI vs attenuation artifact Normal wall motion, EF estimated at 57% No EKG changes concerning for ischemia at peak stress or in recovery. Resting EKG with IVCD Low risk scan __________   LHC 03/16/2014: Mid LAD 99% stenosis status post PCI/DES, distal CX 50% stenosis   EKG:  EKG is not ordered today.    Recent Labs: 11/16/2022: ALT 14; BUN 16; Creatinine, Ser 0.71; Hemoglobin 12.6; Platelets 269; Potassium 4.4; Sodium 131  Recent Lipid Panel    Component Value Date/Time   CHOL 200 (H) 09/20/2015 0928   CHOL 145 03/16/2014 0538   TRIG 156 (H) 09/20/2015 0928   TRIG 173 03/16/2014 0538   HDL 44 09/20/2015 0928   HDL 40 03/16/2014 0538   CHOLHDL 4.5 (H) 09/20/2015 0928   VLDL 35 03/16/2014 0538   LDLCALC 125 (H) 09/20/2015 0928   LDLCALC 70 03/16/2014 0538    PHYSICAL EXAM:    VS:  BP 124/86 (BP Location: Left Arm, Patient Position: Sitting, Cuff Size:  Normal)   Pulse 68   Ht 4\' 11"  (1.499 m)   Wt 134 lb 9.6 oz (61.1 kg)   SpO2 98%   BMI 27.19 kg/m   BMI: Body mass index is 27.19 kg/m.  Physical Exam Vitals reviewed.  Constitutional:      Appearance: She is well-developed.  HENT:     Head: Normocephalic and atraumatic.  Eyes:     General:        Right eye: No discharge.        Left eye: No discharge.  Neck:     Vascular: No JVD.  Cardiovascular:     Rate and Rhythm: Normal rate and regular rhythm.     Heart sounds: S1 normal and S2 normal. Heart sounds not distant. No midsystolic click and no opening snap. Murmur heard.     Systolic murmur is present with a grade of 1/6 at the upper right sternal border.     No friction rub.  Pulmonary:     Effort: Pulmonary effort is normal. No respiratory distress.     Breath sounds: Normal breath sounds. No decreased breath sounds, wheezing, rhonchi or rales.  Chest:     Chest wall: No tenderness.  Abdominal:     General: There is no distension.  Musculoskeletal:     Cervical back: Normal range of motion.     Comments: Trivial bilateral pretibial edema.  Skin:    General: Skin is warm and dry.     Nails: There is no clubbing.  Neurological:     Mental Status: She is alert and oriented to person, place, and time.  Psychiatric:        Speech: Speech normal.        Behavior: Behavior normal.        Thought Content: Thought content normal.        Judgment: Judgment normal.  Wt Readings from Last 3 Encounters:  02/15/23 134 lb 9.6 oz (61.1 kg)  12/22/22 136 lb 12.8 oz (62.1 kg)  12/04/22 132 lb (59.9 kg)     ASSESSMENT & PLAN:   CAD involving the native coronary arteries without angina: She continues to do well and is without symptoms of angina or cardiac decompensation.  Continue aspirin 81 mg daily along with carvedilol.  No indication for further ischemic testing at this time.  ICM: Echo from last month showed normal LV systolic function.  Euvolemic and well  compensated.  Transition from lisinopril to losartan 12.5 mg due to cough.  Otherwise, she remains on carvedilol half tab of 3.125 mg twice daily.  HTN: Blood pressure is well-controlled in the office today.  Continue medical therapy as outlined above.  HLD: LDL 153 in 06/2022.  She is intolerant to all statins.  She did not tolerate ezetimibe secondary to GI symptoms.  Hyponatremia: Trend BMP.    Disposition: F/u with Dr. Kirke Corin or an APP in 2 months.   Medication Adjustments/Labs and Tests Ordered: Current medicines are reviewed at length with the patient today.  Concerns regarding medicines are outlined above. Medication changes, Labs and Tests ordered today are summarized above and listed in the Patient Instructions accessible in Encounters.   Signed, Eula Listen, PA-C 02/15/2023 1:16 PM     [facility] [place] [address] [place], [place] [address] [phone]

## 2023-02-16 LAB — BASIC METABOLIC PANEL
BUN/Creatinine Ratio: 16 (ref 12–28)
BUN: 10 mg/dL (ref 8–27)
CO2: 24 mmol/L (ref 20–29)
Calcium: 9.5 mg/dL (ref 8.7–10.3)
Chloride: 99 mmol/L (ref 96–106)
Creatinine, Ser: 0.63 mg/dL (ref 0.57–1.00)
Glucose: 98 mg/dL (ref 70–99)
Potassium: 5 mmol/L (ref 3.5–5.2)
Sodium: 138 mmol/L (ref 134–144)
eGFR: 85 mL/min/{1.73_m2} (ref >59)

## 2023-03-15 ENCOUNTER — Other Ambulatory Visit: Payer: Self-pay | Admitting: Cardiology

## 2023-04-20 ENCOUNTER — Telehealth: Payer: Self-pay | Admitting: Cardiovascular Disease

## 2023-04-20 NOTE — Telephone Encounter (Signed)
TeleVisit message consent form sent to pt's MyChart   Pt to be called later to verify receipt of message and confirm visit change

## 2023-04-20 NOTE — Telephone Encounter (Signed)
Pt called and she confirmed that she will be able to do a video visit for tomorrow's appt.  Pt directed to consent and prep instructions sent to her MyChart account

## 2023-04-20 NOTE — Telephone Encounter (Signed)
Ok for virtual visit

## 2023-04-20 NOTE — Telephone Encounter (Signed)
Patient's daughter is requesting call back to discuss appt scheduled for tomorrow. She would like to know if this appt could be virtual. Please advise.

## 2023-04-21 ENCOUNTER — Encounter: Payer: Self-pay | Admitting: Physician Assistant

## 2023-04-21 ENCOUNTER — Ambulatory Visit: Payer: Medicare Other | Attending: Physician Assistant | Admitting: Physician Assistant

## 2023-04-21 VITALS — BP 134/74 | HR 72 | Ht 59.0 in | Wt 134.0 lb

## 2023-04-21 DIAGNOSIS — I10 Essential (primary) hypertension: Secondary | ICD-10-CM | POA: Diagnosis not present

## 2023-04-21 DIAGNOSIS — I2510 Atherosclerotic heart disease of native coronary artery without angina pectoris: Secondary | ICD-10-CM

## 2023-04-21 DIAGNOSIS — E785 Hyperlipidemia, unspecified: Secondary | ICD-10-CM

## 2023-04-21 DIAGNOSIS — Z789 Other specified health status: Secondary | ICD-10-CM | POA: Diagnosis present

## 2023-04-21 DIAGNOSIS — I255 Ischemic cardiomyopathy: Secondary | ICD-10-CM | POA: Diagnosis not present

## 2023-04-21 NOTE — Progress Notes (Signed)
Virtual Visit via Video Note   Because of Kendra Prince's co-morbid illnesses, she is at least at moderate risk for complications without adequate follow up.  This format is felt to be most appropriate for this patient at this time.  All issues noted in this document were discussed and addressed.  A limited physical exam was performed with this format.  Please refer to the patient's chart for her consent to telehealth for [facility].       Date:  04/21/2023   ID:  Kendra Prince, DOB 12-02-1931, MRN [id number] The patient was identified using 2 identifiers.  Patient Location: Home Provider Location: Office/Clinic   PCP:  Bary Leriche, MD   [facility] Providers Cardiologist:  Lorine Bears, MD {  Evaluation Performed:  Follow-Up Visit  Chief Complaint:  Follow up  History of Present Illness:    Kendra Prince is a 89 y.o. female with CAD with NSTEMI in 02/2014 status post PCI/DES to the mid LAD, HFrEF secondary to ICM, DM2, HTN, HLD with statin intolerance, LBBB, and hypothyroidism who is seen virtually today for follow-up of HTN.   She was admitted to the hospital in 02/2014 with an NSTEMI with anterior ischemic changes on EKG.  LHC showed 99% mid LAD stenosis treated successfully with PCI/DES.  EF of 40%.  Most recent ischemic evaluation in 03/2017 via Lexiscan MPI which showed no evidence of ischemia with a normal EF of 57%.  Echo in 11/2018 showed an EF of 45 to 50% with moderate pulmonary hypertension with an RVSP of 46 mmHg.  Echo from 01/2022 showed an EF of 50 to 55% with mild pulmonary hypertension.     She underwent Mohs surgery of her hand in 10/2022.  Following this, she presented to the ER for evaluation of bleeding in her hand and associated hypotension with lightheadedness felt to be vagal in etiology.  Hgb trended from 13.1 to 12.6 in the ER.  Has subsequently been evaluated by hematology.   She was seen in the office on  12/22/2022 and was without symptoms of angina or cardiac decompensation.  She was without further bleeding episodes.  Swelling along the right hand was improving.  Blood pressure at home range from the 90s to 1 teens systolic with an occasional reading in the 130s millimeter mercury systolic.  She was taking a half of a carvedilol 3.125 mg twice daily and lisinopril 10 mg.  She had not been taking aspirin.  She underwent echo on 01/16/2023 that showed an EF of 55 to 60%, no regional wall motion abnormalities, grade 1 diastolic dysfunction, normal RV systolic function and ventricular cavity size, mildly elevated RVSP estimated at 42.2 mmHg, mildly dilated left atrium, mild mitral regurgitation, mild to moderate tricuspid regurgitation, aortic valve insufficiency with aortic valve sclerosis without evidence of stenosis, and an estimated right atrial pressure of 3 mmHg.  Carotid artery ultrasound in 12/2022 showed 1 to 39% right ICA stenosis with no evidence of stenosis in the left ICA, antegrade flow of the bilateral vertebral arteries, and normal flow hemodynamics of the bilateral subclavian arteries.  She was last seen in the office in 01/2023 and remained without symptoms of angina or cardiac decompensation.  Overall blood pressures are reasonably controlled at home, taking a half tab of carvedilol 3.125 mg twice daily and lisinopril 5 mg at lunchtime.  She was drinking Liquid IV 2 to 3 days/week.  Blood pressure was well-controlled in the office at 124/86.  She was transition from lisinopril to losartan due to dry cough.  Otherwise, she was continued on current dose of carvedilol.  Following transition to losartan she noted difficulty sleeping with muscle spasms at night, arthralgias, and cough.  Losartan was discontinued at that time with recommendation to follow-up today to trend blood pressure.  She is seen virtually today due to recent weather.  She is doing well and without symptoms of angina or cardiac  decompensation.  Since coming off losartan, myalgias, arthralgias, and cough have improved.  Memory and functional status also improved.  Patient and her daughter are pleased with improvement and feel like she is doing well at this time.  Currently taking carvedilol half a tab of 3.125 mg twice daily with overall well-controlled blood pressures on home BP log with a rare elevated BP reading in the 160s systolic, with most readings in the 120s to 140s systolic.     Past Medical History:  Diagnosis Date   Chronic systolic heart failure (HCC)    a. 02/2014 LV gram: EF 40%, mod antlat HK, sev apical HK; b. 03/2017 MV: EF 57%; c. 11/2018 Echo: EF 45-50%, impaired relaxation. Nl RV size/fxn. Mildly dil LA. Mod elev PASP. Asc Ao 36mm.   Coronary artery disease    a. 02/2014 NSTEMI/PCI: LM nl, LAD 69m (2.5x33 Xience Alpine DES), LCX 50d, OM1/2 nl, RCA nl, RPDA nl, RPL1 nl, EF 40%; b. 03/2017 MV: EF 57%. Fixed apical and apical septal defect - MI vs attenuation. No ischemia.   Diabetes mellitus without complication (HCC)    Hyperlipidemia    a. Statin intolerant; b. Could not afford Zetia.   Hypertension    Hypothyroidism    Ischemic cardiomyopathy    a. 02/2014 LV gram: EF 40%, mod antlat HK, sev apical HK.   LBBB (left bundle branch block)    MI (myocardial infarction) (HCC) 02/2014   Past Surgical History:  Procedure Laterality Date   CARDIAC CATHETERIZATION  03/16/2014   x1 stent   CARPAL TUNNEL RELEASE     MOHS SURGERY     SHOULDER SURGERY     TONSILLECTOMY     TOTAL ABDOMINAL HYSTERECTOMY     TOTAL KNEE ARTHROPLASTY Left      Current Meds  Medication Sig   albuterol (PROVENTIL HFA;VENTOLIN HFA) 108 (90 BASE) MCG/ACT inhaler Inhale 2 puffs into the lungs every 6 (six) hours as needed for wheezing or shortness of breath.   aspirin EC 81 MG tablet Take 1 tablet (81 mg total) by mouth daily. Swallow whole.   b complex vitamins capsule Take 1 capsule by mouth daily.   carvedilol (COREG)  3.125 MG tablet TAKE (1) TABLET BY MOUTH TWICE DAILY WITH A MEAL   gabapentin (NEURONTIN) 100 MG capsule Take 200 mg by mouth at bedtime.   levothyroxine (SYNTHROID, LEVOTHROID) 100 MCG tablet Take 100 mcg by mouth daily.   melatonin 5 MG TABS Take 2 mg by mouth at bedtime.     Allergies:   Amlodipine, Norvasc [amlodipine besylate], Statins, Sulfa antibiotics, Tape, and Codeine   Social History   Tobacco Use   Smoking status: Never   Smokeless tobacco: Never  Vaping Use   Vaping status: Never Used  Substance Use Topics   Alcohol use: No   Drug use: No     Family Hx: The patient's family history includes Cancer in her maternal grandmother; Heart attack (age of onset: 46) in her brother; Heart attack (age of onset: 59) in her father; Skin  cancer in an other family member.  ROS:   Please see the history of present illness.     All other systems reviewed and are negative.   Prior CV studies:   The following studies were reviewed today:  2D echo 01/12/2023: 1. Left ventricular ejection fraction, by estimation, is 55 to 60%. Left  ventricular ejection fraction by 3D volume is 58 %. The left ventricle has  normal function. The left ventricle has no regional wall motion  abnormalities. Left ventricular diastolic   parameters are consistent with Grade I diastolic dysfunction (impaired  relaxation). The average left ventricular global longitudinal strain is  -18.2 %.   2. Right ventricular systolic function is normal. The right ventricular  size is normal. There is mildly elevated pulmonary artery systolic  pressure.   3. Left atrial size was mildly dilated.   4. The mitral valve is normal in structure. Mild mitral valve  regurgitation. No evidence of mitral stenosis.   5. Tricuspid valve regurgitation is mild to moderate.   6. The aortic valve is tricuspid. Aortic valve regurgitation is moderate.  Aortic valve sclerosis is present, with no evidence of aortic valve  stenosis.    7. The inferior vena cava is normal in size with greater than 50%  respiratory variability, suggesting right atrial pressure of 3 mmHg.  __________   Carotid artery ultrasound 01/04/2023: Summary:  Right Carotid: Velocities in the right ICA are consistent with a 1-39% stenosis.   Left Carotid: There is no evidence of stenosis in the left ICA.   Vertebrals:  Bilateral vertebral arteries demonstrate antegrade flow.  Subclavians: Normal flow hemodynamics were seen in bilateral subclavian arteries.  __________   2D echo 02/17/2022: 1. Left ventricular ejection fraction, by estimation, is 50 to 55%. The  left ventricle has low normal function. The left ventricle has no regional  wall motion abnormalities. There is mild left ventricular hypertrophy of  the basal-septal segment. Left  ventricular diastolic parameters are consistent with Grade I diastolic  dysfunction (impaired relaxation). The average left ventricular global  longitudinal strain is -12.5 %.   2. Right ventricular systolic function is normal. The right ventricular  size is normal. There is mildly elevated pulmonary artery systolic  pressure. The estimated right ventricular systolic pressure is 39.6 mmHg.   3. The mitral valve is normal in structure. No evidence of mitral valve  regurgitation. No evidence of mitral stenosis.   4. The aortic valve is tricuspid. Aortic valve regurgitation is mild.  Aortic valve sclerosis is present, with no evidence of aortic valve  stenosis.   5. There is borderline dilatation of the ascending aorta, measuring 39  mm.   6. The inferior vena cava is normal in size with greater than 50%  respiratory variability, suggesting right atrial pressure of 3 mmHg.   Comparison(s): 12/22/18-EF 45-50%;  __________   2D echo 12/22/2018: 1. Left ventricular ejection fraction, by visual estimation, is 45 to  50%. The left ventricle has normal function. Normal left ventricular size.  There is mildly  increased left ventricular hypertrophy.Unable to exclude  septal wall hypokinesis.   2. Left ventricular diastolic Doppler parameters are consistent with  impaired relaxation pattern of LV diastolic filling.   3. Global right ventricle has normal systolic function.The right  ventricular size is normal. No increase in right ventricular wall  thickness.   4. Moderately elevated pulmonary artery systolic pressure.   5. Left atrial size was mildly dilated.   6. There is very mild  dilatation of the ascending aorta measuring 36 mm.  __________   Eugenie Birks MPI 04/14/2017: Pharmacological myocardial perfusion imaging study with no significant ischemia Small region of mild fixed apical and apical septal perfusion defect, consistent with previous MI vs attenuation artifact Normal wall motion, EF estimated at 57% No EKG changes concerning for ischemia at peak stress or in recovery. Resting EKG with IVCD Low risk scan __________   LHC 03/16/2014: Mid LAD 99% stenosis status post PCI/DES, distal CX 50% stenosis  Labs/Other Tests and Data Reviewed:    EKG:  An ECG dated 12/22/2022 was personally reviewed today and demonstrated:  NSR, 69 bpm, bifascicular block with first-degree block, LBBB  Recent Labs: 11/16/2022: ALT 14; Hemoglobin 12.6; Platelets 269 02/15/2023: BUN 10; Creatinine, Ser 0.63; Potassium 5.0; Sodium 138   Recent Lipid Panel Lab Results  Component Value Date/Time   CHOL 200 (H) 09/20/2015 09:28 AM   CHOL 145 03/16/2014 05:38 AM   TRIG 156 (H) 09/20/2015 09:28 AM   TRIG 173 03/16/2014 05:38 AM   HDL 44 09/20/2015 09:28 AM   HDL 40 03/16/2014 05:38 AM   CHOLHDL 4.5 (H) 09/20/2015 09:28 AM   LDLCALC 125 (H) 09/20/2015 09:28 AM   LDLCALC 70 03/16/2014 05:38 AM    Wt Readings from Last 3 Encounters:  04/21/23 134 lb (60.8 kg)  02/15/23 134 lb 9.6 oz (61.1 kg)  12/22/22 136 lb 12.8 oz (62.1 kg)     Risk Assessment/Calculations:          Objective:    Vital Signs:  BP  134/74 (BP Location: Left Arm, Patient Position: Sitting, Cuff Size: Normal) Comment: patients account  Pulse 72 Comment: patients account  Ht 4\' 11"  (1.499 m) Comment: patients account  Wt 134 lb (60.8 kg) Comment: patients account  BMI 27.06 kg/m    VITAL SIGNS:  reviewed GEN:  no acute distress EYES:  sclerae anicteric, EOMI - Extraocular Movements Intact  ASSESSMENT & PLAN:    CAD involving native coronary arteries without angina: She continues to do well and is without symptoms of angina or cardiac decompensation. Continue aspirin 81 mg daily along with carvedilol. No indication for further ischemic testing at this time.   ICM: Echo in late 2024 showed normal LV systolic function.  Appears euvolemic and well compensated on video visit.  Remains on half tab of carvedilol 3.125 mg twice daily.  No longer on ACE inhibitor/ARB as outlined below.  With euvolemia, and normalization of LV systolic function, defer escalation of pharmacotherapy at this time.  HTN: Blood pressure is overall well-controlled at home on half tab of carvedilol 3.125 mg twice daily.  Symptoms of cough, myalgias, arthralgias, insomnia, memory deficit have resolved following discontinuation of losartan.  Cough previously noted on lisinopril as well.  With well-controlled BP, defer changes in pharmacotherapy at this time.  HLD: LDL 153 in 06/2022.  She is intolerant to all statins.  She did not tolerate ezetimibe secondary to GI symptoms.      Time:   Today, I have spent 9 minutes with the patient with telehealth technology discussing the above problems.     Medication Adjustments/Labs and Tests Ordered: Current medicines are reviewed at length with the patient today.  Concerns regarding medicines are outlined above.   Tests Ordered: No orders of the defined types were placed in this encounter.   Medication Changes: No orders of the defined types were placed in this encounter.   Follow Up:  In Person in 3  month(s)  Elinor Dodge, PA-C  04/21/2023 12:09 PM    [facility]

## 2023-04-21 NOTE — Patient Instructions (Signed)
Medication Instructions:  No changes *If you need a refill on your cardiac medications before your next appointment, please call your pharmacy*   Lab Work: None ordered If you have labs (blood work) drawn today and your tests are completely normal, you will receive your results only by: MyChart Message (if you have MyChart) OR A paper copy in the mail If you have any lab test that is abnormal or we need to change your treatment, we will call you to review the results.   Testing/Procedures: None ordered   Follow-Up: At [facility], you and your health needs are our priority.  As part of our continuing mission to provide you with exceptional heart care, we have created designated Provider Care Teams.  These Care Teams include your primary Cardiologist (physician) and Advanced Practice Providers (APPs -  Physician Assistants and Nurse Practitioners) who all work together to provide you with the care you need, when you need it.  We recommend signing up for the patient portal called "MyChart".  Sign up information is provided on this After Visit Summary.  MyChart is used to connect with patients for Virtual Visits (Telemedicine).  Patients are able to view lab/test results, encounter notes, upcoming appointments, etc.  Non-urgent messages can be sent to your provider as well.   To learn more about what you can do with MyChart, go to [email or web address].    Your next appointment:   3 month(s)  Provider:   You may see Lorine Bears, MD or one of the following Advanced Practice Providers on your designated Care Team:   Eula Listen, [place]

## 2023-06-01 ENCOUNTER — Encounter (INDEPENDENT_AMBULATORY_CARE_PROVIDER_SITE_OTHER): Payer: Self-pay | Admitting: Otolaryngology

## 2023-06-30 ENCOUNTER — Ambulatory Visit: Payer: Self-pay

## 2023-07-27 ENCOUNTER — Other Ambulatory Visit: Payer: Self-pay | Admitting: Emergency Medicine

## 2023-07-27 MED ORDER — LISINOPRIL 5 MG PO TABS: 5.0000 mg | ORAL_TABLET | Freq: Two times a day (BID) | ORAL | 3 refills | Status: AC

## 2023-08-20 NOTE — Progress Notes (Unsigned)
 Cardiology Office Note    Date:  08/25/2023   ID:  Janilah, Hojnacki 03/21/1932, MRN [id number]  PCP:  Judge Notice, MD  Cardiologist:  Antionette Kirks, MD  Electrophysiologist:  None   Chief Complaint: Follow up  History of Present Illness:   IONE SANDUSKY is a 89 y.o. female with history of CAD with NSTEMI in 02/2014 status post PCI/DES to the mid LAD, HFimpEF secondary to ICM, DM2, HTN, HLD with statin intolerance, LBBB, and hypothyroidism who is seen virtually today for follow-up of HTN.   She was admitted to the hospital in 02/2014 with an NSTEMI with anterior ischemic changes on EKG.  LHC showed 99% mid LAD stenosis treated successfully with PCI/DES.  EF of 40%.  Most recent ischemic evaluation in 03/2017 via Lexiscan  MPI which showed no evidence of ischemia with a normal EF of 57%.  Echo in 11/2018 showed an EF of 45 to 50% with moderate pulmonary hypertension with an RVSP of 46 mmHg.  Echo from 01/2022 showed an EF of 50 to 55% with mild pulmonary hypertension.     She underwent Mohs surgery of her hand in 10/2022.  Following this, she presented to the ER for evaluation of bleeding in her hand and associated hypotension with lightheadedness felt to be vagal in etiology.  Hgb trended from 13.1 to 12.6 in the ER.  Has subsequently been evaluated by hematology.   She was seen in the office on 12/22/2022 and was without symptoms of angina or cardiac decompensation.  She was without further bleeding episodes.  Swelling along the right hand was improving.  Blood pressure at home range from the 90s to 1 teens systolic with an occasional reading in the 130s millimeter mercury systolic.  She was taking a half of a carvedilol  3.125 mg twice daily and lisinopril  10 mg.  She had not been taking aspirin .  She underwent echo on 01/16/2023 that showed an EF of 55 to 60%, no regional wall motion abnormalities, grade 1 diastolic dysfunction, normal RV systolic function and ventricular  cavity size, mildly elevated RVSP estimated at 42.2 mmHg, mildly dilated left atrium, mild mitral regurgitation, mild to moderate tricuspid regurgitation, moderate aortic valve insufficiency with aortic valve sclerosis without evidence of stenosis, and an estimated right atrial pressure of 3 mmHg.  Carotid artery ultrasound in 12/2022 showed 1 to 39% right ICA stenosis with no evidence of stenosis in the left ICA, antegrade flow of the bilateral vertebral arteries, and normal flow hemodynamics of the bilateral subclavian arteries.  She was seen in the office in 01/2023 and remained without symptoms of angina or cardiac decompensation.  Overall blood pressures were reasonably controlled at home, taking a half tab of carvedilol  3.125 mg twice daily and lisinopril  5 mg at lunchtime.  She was drinking Liquid IV 2 to 3 days/week.  Blood pressure was well-controlled in the office at 124/86.  She was transitioned from lisinopril  to losartan  due to dry cough.  Otherwise, she was continued on current dose of carvedilol .  Following transition to losartan  she noted difficulty sleeping with muscle spasms at night, arthralgias, and cough.  Losartan  was discontinued at that time with recommendation to follow-up to trend blood pressure.  She was seen virtually in 03/2023 due to winter weather and reported improvement in myalgias, arthralgias, and cough following discontinuation of losartan .  Memory and functional status were also improved.  She was taking carvedilol  half a tablet 3.125 mg twice daily with well-controlled blood pressures in  the 120s to 140s mmHg systolic.  Following this visit she reported some elevations in blood pressure, increased anxiety, and insomnia.  In the setting of persistent elevation in BP readings that she was restarted on lisinopril , ultimately at 5 mg twice daily with improvement in blood pressure and symptoms.  She comes in accompanied by her daughter today and is doing well from a cardiac  perspective.  Blood pressure log shows continued improvement in BP readings on lisinopril  5 mg at 8 AM and 8 PM with a half tab of carvedilol  3.125 mg twice daily.  On 5/15 she did have a BP of 101/56 with associated weakness and dizziness and in the setting has been holding the morning dose of lisinopril  and is currently taking carvedilol  half tab of 3.125 mg twice daily and lisinopril  5 mg in the evening with BP overall well-controlled in the 1 teens to 130s largely.  Blood pressure is elevated in the office today, though has been well-controlled at home as above.  She also reports improvement in functional status, dizziness, and mental status with stabilization of BP.  However, she does continue to report diminished hearing along the right ear.  She reports 2 remote episodes of confusion and visual changes while living in [place]  years ago of uncertain etiology as well.   Labs independently reviewed: 06/2023 - Hgb 13.7, PLT 237, BUN 12, SCr 0.8, potassium 4.6, albumin 4.4, AST/ALT normal, TSH 6.25, T4 normal,  10/2022 - Hgb 12.3, PLT 290 06/2022 - TC 234, TG 181, HDL 48, LDL 153  Past Medical History:  Diagnosis Date   Chronic systolic heart failure (HCC)    a. 02/2014 LV gram: EF 40%, mod antlat HK, sev apical HK; b. 03/2017 MV: EF 57%; c. 11/2018 Echo: EF 45-50%, impaired relaxation. Nl RV size/fxn. Mildly dil LA. Mod elev PASP. Asc Ao 36mm.   Coronary artery disease    a. 02/2014 NSTEMI/PCI: LM nl, LAD 24m (2.5x33 Xience Alpine DES), LCX 50d, OM1/2 nl, RCA nl, RPDA nl, RPL1 nl, EF 40%; b. 03/2017 MV: EF 57%. Fixed apical and apical septal defect - MI vs attenuation. No ischemia.   Diabetes mellitus without complication (HCC)    Hyperlipidemia    a. Statin intolerant; b. Could not afford Zetia .   Hypertension    Hypothyroidism    Ischemic cardiomyopathy    a. 02/2014 LV gram: EF 40%, mod antlat HK, sev apical HK.   LBBB (left bundle branch block)    MI (myocardial infarction) (HCC) 02/2014     Past Surgical History:  Procedure Laterality Date   CARDIAC CATHETERIZATION  03/16/2014   x1 stent   CARPAL TUNNEL RELEASE     MOHS SURGERY     SHOULDER SURGERY     TONSILLECTOMY     TOTAL ABDOMINAL HYSTERECTOMY     TOTAL KNEE ARTHROPLASTY Left     Current Medications: Current Meds  Medication Sig   albuterol  (PROVENTIL  HFA;VENTOLIN  HFA) 108 (90 BASE) MCG/ACT inhaler Inhale 2 puffs into the lungs every 6 (six) hours as needed for wheezing or shortness of breath.   aspirin  EC 81 MG tablet Take 1 tablet (81 mg total) by mouth daily. Swallow whole.   b complex vitamins capsule Take 1 capsule by mouth daily.   carvedilol  (COREG ) 3.125 MG tablet TAKE (1) TABLET BY MOUTH TWICE DAILY WITH A MEAL   diazepam (VALIUM) 5 MG tablet Take 2.5 mg by mouth at bedtime as needed. prn   gabapentin (NEURONTIN) 100 MG capsule Take  200 mg by mouth at bedtime.   levothyroxine (SYNTHROID, LEVOTHROID) 100 MCG tablet Take 100 mcg by mouth daily.   lisinopril  (ZESTRIL ) 5 MG tablet Take 1 tablet (5 mg total) by mouth in the morning and at bedtime.   UNABLE TO FIND Take 1 tablet by mouth daily. Standard Process A-F Beta food    Allergies:   Amlodipine , Norvasc  [amlodipine  besylate], Statins, Sulfa antibiotics, Tape, and Codeine   Social History   Socioeconomic History   Marital status: Widowed    Spouse name: Not on file   Number of children: Not on file   Years of education: Not on file   Highest education level: Not on file  Occupational History   Not on file  Tobacco Use   Smoking status: Never   Smokeless tobacco: Never  Vaping Use   Vaping status: Never Used  Substance and Sexual Activity   Alcohol use: No   Drug use: No   Sexual activity: Not on file  Other Topics Concern   Not on file  Social History Narrative   Not on file   Social Drivers of Health   Financial Resource Strain: Not on file  Food Insecurity: Not on file  Transportation Needs: Not on file  Physical Activity:  Not on file  Stress: Not on file  Social Connections: Not on file     Family History:  The patient's family history includes Cancer in her maternal grandmother; Heart attack (age of onset: 31) in her brother; Heart attack (age of onset: 55) in her father; Skin cancer in an other family member.  ROS:   12-point review of systems is negative unless otherwise noted in the HPI.   EKGs/Labs/Other Studies Reviewed:    Studies reviewed were summarized above. The additional studies were reviewed today:  2D echo 01/12/2023: 1. Left ventricular ejection fraction, by estimation, is 55 to 60%. Left  ventricular ejection fraction by 3D volume is 58 %. The left ventricle has  normal function. The left ventricle has no regional wall motion  abnormalities. Left ventricular diastolic   parameters are consistent with Grade I diastolic dysfunction (impaired  relaxation). The average left ventricular global longitudinal strain is  -18.2 %.   2. Right ventricular systolic function is normal. The right ventricular  size is normal. There is mildly elevated pulmonary artery systolic  pressure.   3. Left atrial size was mildly dilated.   4. The mitral valve is normal in structure. Mild mitral valve  regurgitation. No evidence of mitral stenosis.   5. Tricuspid valve regurgitation is mild to moderate.   6. The aortic valve is tricuspid. Aortic valve regurgitation is moderate.  Aortic valve sclerosis is present, with no evidence of aortic valve  stenosis.   7. The inferior vena cava is normal in size with greater than 50%  respiratory variability, suggesting right atrial pressure of 3 mmHg.  __________   Carotid artery ultrasound 01/04/2023: Summary:  Right Carotid: Velocities in the right ICA are consistent with a 1-39% stenosis.   Left Carotid: There is no evidence of stenosis in the left ICA.   Vertebrals:  Bilateral vertebral arteries demonstrate antegrade flow.  Subclavians: Normal flow  hemodynamics were seen in bilateral subclavian arteries.  __________   2D echo 02/17/2022: 1. Left ventricular ejection fraction, by estimation, is 50 to 55%. The  left ventricle has low normal function. The left ventricle has no regional  wall motion abnormalities. There is mild left ventricular hypertrophy of  the basal-septal  segment. Left  ventricular diastolic parameters are consistent with Grade I diastolic  dysfunction (impaired relaxation). The average left ventricular global  longitudinal strain is -12.5 %.   2. Right ventricular systolic function is normal. The right ventricular  size is normal. There is mildly elevated pulmonary artery systolic  pressure. The estimated right ventricular systolic pressure is 39.6 mmHg.   3. The mitral valve is normal in structure. No evidence of mitral valve  regurgitation. No evidence of mitral stenosis.   4. The aortic valve is tricuspid. Aortic valve regurgitation is mild.  Aortic valve sclerosis is present, with no evidence of aortic valve  stenosis.   5. There is borderline dilatation of the ascending aorta, measuring 39  mm.   6. The inferior vena cava is normal in size with greater than 50%  respiratory variability, suggesting right atrial pressure of 3 mmHg.   Comparison(s): 12/22/18-EF 45-50%;  __________   2D echo 12/22/2018: 1. Left ventricular ejection fraction, by visual estimation, is 45 to  50%. The left ventricle has normal function. Normal left ventricular size.  There is mildly increased left ventricular hypertrophy.Unable to exclude  septal wall hypokinesis.   2. Left ventricular diastolic Doppler parameters are consistent with  impaired relaxation pattern of LV diastolic filling.   3. Global right ventricle has normal systolic function.The right  ventricular size is normal. No increase in right ventricular wall  thickness.   4. Moderately elevated pulmonary artery systolic pressure.   5. Left atrial size was mildly  dilated.   6. There is very mild dilatation of the ascending aorta measuring 36 mm.  __________   Lexiscan  MPI 04/14/2017: Pharmacological myocardial perfusion imaging study with no significant ischemia Small region of mild fixed apical and apical septal perfusion defect, consistent with previous MI vs attenuation artifact Normal wall motion, EF estimated at 57% No EKG changes concerning for ischemia at peak stress or in recovery. Resting EKG with IVCD Low risk scan __________   LHC 03/16/2014: Mid LAD 99% stenosis status post PCI/DES, distal CX 50% stenosis   EKG:  EKG is ordered today.  The EKG ordered today demonstrates NSR, 61 bpm, 1st degree AV block, LBBB  Recent Labs: 11/16/2022: ALT 14; Hemoglobin 12.6; Platelets 269 02/15/2023: BUN 10; Creatinine, Ser 0.63; Potassium 5.0; Sodium 138  Recent Lipid Panel    Component Value Date/Time   CHOL 200 (H) 09/20/2015 0928   CHOL 145 03/16/2014 0538   TRIG 156 (H) 09/20/2015 0928   TRIG 173 03/16/2014 0538   HDL 44 09/20/2015 0928   HDL 40 03/16/2014 0538   CHOLHDL 4.5 (H) 09/20/2015 0928   VLDL 35 03/16/2014 0538   LDLCALC 125 (H) 09/20/2015 0928   LDLCALC 70 03/16/2014 0538    PHYSICAL EXAM:    VS:  BP (!) 176/70   Pulse 61   Ht 4\' 11"  (1.499 m)   Wt 136 lb 6.4 oz (61.9 kg)   SpO2 95%   BMI 27.55 kg/m   BMI: Body mass index is 27.55 kg/m.  Physical Exam Vitals reviewed.  Constitutional:      Appearance: She is well-developed.  HENT:     Head: Normocephalic and atraumatic.  Eyes:     General:        Right eye: No discharge.        Left eye: No discharge.  Cardiovascular:     Rate and Rhythm: Normal rate and regular rhythm.     Heart sounds: S1 normal and S2 normal. Heart  sounds not distant. No midsystolic click and no opening snap. Murmur heard.     Systolic murmur is present with a grade of 1/6 at the upper right sternal border.     No friction rub.  Pulmonary:     Effort: Pulmonary effort is normal. No  respiratory distress.     Breath sounds: Normal breath sounds. No decreased breath sounds, wheezing, rhonchi or rales.  Chest:     Chest wall: No tenderness.  Musculoskeletal:     Cervical back: Normal range of motion.  Skin:    General: Skin is warm and dry.     Nails: There is no clubbing.  Neurological:     Mental Status: She is alert and oriented to person, place, and time.  Psychiatric:        Speech: Speech normal.        Behavior: Behavior normal.        Thought Content: Thought content normal.        Judgment: Judgment normal.     Wt Readings from Last 3 Encounters:  08/25/23 136 lb 6.4 oz (61.9 kg)  04/21/23 134 lb (60.8 kg)  02/15/23 134 lb 9.6 oz (61.1 kg)     ASSESSMENT & PLAN:   CAD involving native coronary arteries without angina: She is doing well and without symptoms concerning for angina or cardiac decompensation.  Continue aspirin  81 mg daily along with carvedilol .  No indication for further ischemic testing at this time.  ICM: Most recent echo from 12/2022 showed normal LV systolic function.  She remains on carvedilol  and lisinopril .  Not requiring standing loop diuretic.  Given lack of heart failure symptoms and normalization of LV systolic function, no indication for further escalation of pharmacotherapy at this time.  HTN: Blood pressure is elevated in the office today though has been well-controlled at home.  Continue carvedilol  at half tab of 3.125 mg twice daily and lisinopril  5 mg in the evening.  HLD with statin intolerance: LDL 153 in 06/2022.  She was intolerant to all statins and ezetimibe .  Dizziness: Improving.  Of uncertain etiology as BP was largely stable during these episodes.  She reported some generalized right-sided weakness during these episodes as well.  She continues to have decreased hearing along the right side.  Given constellation of symptoms we have agreed to pursue MRI of the brain.  If stroke is identified, would pursue cardiac  monitoring to evaluate for potential atrial arrhythmia.     Disposition: F/u with Dr. Alvenia Aus or an APP in 6 months.   Medication Adjustments/Labs and Tests Ordered: Current medicines are reviewed at length with the patient today.  Concerns regarding medicines are outlined above. Medication changes, Labs and Tests ordered today are summarized above and listed in the Patient Instructions accessible in Encounters.   Signed, Varney Gentleman, PA-C 08/25/2023 4:56 PM     [facility] [place] [address] [place], [place] [address] [phone]

## 2023-08-25 ENCOUNTER — Encounter: Payer: Self-pay | Admitting: Physician Assistant

## 2023-08-25 ENCOUNTER — Ambulatory Visit: Attending: Physician Assistant | Admitting: Physician Assistant

## 2023-08-25 VITALS — BP 176/70 | HR 61 | Ht 59.0 in | Wt 136.4 lb

## 2023-08-25 DIAGNOSIS — I10 Essential (primary) hypertension: Secondary | ICD-10-CM

## 2023-08-25 DIAGNOSIS — R42 Dizziness and giddiness: Secondary | ICD-10-CM | POA: Diagnosis present

## 2023-08-25 DIAGNOSIS — I2510 Atherosclerotic heart disease of native coronary artery without angina pectoris: Secondary | ICD-10-CM

## 2023-08-25 DIAGNOSIS — I255 Ischemic cardiomyopathy: Secondary | ICD-10-CM | POA: Diagnosis present

## 2023-08-25 DIAGNOSIS — Z789 Other specified health status: Secondary | ICD-10-CM | POA: Diagnosis present

## 2023-08-25 DIAGNOSIS — E785 Hyperlipidemia, unspecified: Secondary | ICD-10-CM

## 2023-08-25 NOTE — Patient Instructions (Signed)
 Medication Instructions:   Your physician recommends that you continue on your current medications as directed. Please refer to the Current Medication list given to you today.  *If you need a refill on your cardiac medications before your next appointment, please call your pharmacy*  Lab Work:  No labs ordered today   If you have labs (blood work) drawn today and your tests are completely normal, you will receive your results only by: MyChart Message (if you have MyChart) OR A paper copy in the mail If you have any lab test that is abnormal or we need to change your treatment, we will call you to review the results.  Testing/Procedures  BRAIN MRI w/o Contrast  Location:  [facility]  Do not wear jewelry, especially earrings.   Follow-Up: At [facility], you and your health needs are our priority.  As part of our continuing mission to provide you with exceptional heart care, our providers are all part of one team.  This team includes your primary Cardiologist (physician) and Advanced Practice Providers or APPs (Physician Assistants and Nurse Practitioners) who all work together to provide you with the care you need, when you need it.  Your next appointment:    6 month(s)  Provider:    Varney Gentleman, PA-C

## 2023-08-30 ENCOUNTER — Ambulatory Visit (INDEPENDENT_AMBULATORY_CARE_PROVIDER_SITE_OTHER): Admitting: Otolaryngology

## 2023-08-30 ENCOUNTER — Encounter (INDEPENDENT_AMBULATORY_CARE_PROVIDER_SITE_OTHER): Payer: Self-pay | Admitting: Otolaryngology

## 2023-08-30 VITALS — BP 163/72 | HR 70

## 2023-08-30 DIAGNOSIS — R42 Dizziness and giddiness: Secondary | ICD-10-CM | POA: Diagnosis not present

## 2023-08-30 DIAGNOSIS — R0981 Nasal congestion: Secondary | ICD-10-CM

## 2023-08-30 DIAGNOSIS — J310 Chronic rhinitis: Secondary | ICD-10-CM

## 2023-08-30 DIAGNOSIS — H6981 Other specified disorders of Eustachian tube, right ear: Secondary | ICD-10-CM

## 2023-08-31 DIAGNOSIS — J310 Chronic rhinitis: Secondary | ICD-10-CM | POA: Insufficient documentation

## 2023-08-31 DIAGNOSIS — R42 Dizziness and giddiness: Secondary | ICD-10-CM | POA: Insufficient documentation

## 2023-08-31 NOTE — Progress Notes (Signed)
 CC: Recurrent dizziness, nasal congestion, clogging sensation in right ear  HPI:  Kendra Prince is an 89 y.o. female who presents today complaining of recurrent dizziness for the past 10+ years.  She describes her dizziness as an occasional spinning sensation in the last 4 hours.  In between vertigo episodes, she also has difficulty with her balance.  The patient has been self treating with Dramamine.  The patient has a history of bilateral sensorineural hearing loss, due to previous loud noise exposure.  She also complains of bilateral high-pitched tinnitus.  She has a history of diabetes, chronic heart failure, coronary artery disease, and myocardial infarctions.  She was treated with angioplasty.  In addition, the patient also complains of chronic nasal congestion and clogging sensation in her right ear.  She has difficulty performing the Valsalva exercise to auto insufflate her right middle ear space.  She has a history of environmental allergies.  She uses Flonase and Zyrtec as needed.  She has no previous ENT surgery except for childhood tonsillectomy.  Past Medical History:  Diagnosis Date   Chronic systolic heart failure (HCC)    a. 02/2014 LV gram: EF 40%, mod antlat HK, sev apical HK; b. 03/2017 MV: EF 57%; c. 11/2018 Echo: EF 45-50%, impaired relaxation. Nl RV size/fxn. Mildly dil LA. Mod elev PASP. Asc Ao 36mm.   Coronary artery disease    a. 02/2014 NSTEMI/PCI: LM nl, LAD 45m (2.5x33 Xience Alpine DES), LCX 50d, OM1/2 nl, RCA nl, RPDA nl, RPL1 nl, EF 40%; b. 03/2017 MV: EF 57%. Fixed apical and apical septal defect - MI vs attenuation. No ischemia.   Diabetes mellitus without complication (HCC)    Hyperlipidemia    a. Statin intolerant; b. Could not afford Zetia .   Hypertension    Hypothyroidism    Ischemic cardiomyopathy    a. 02/2014 LV gram: EF 40%, mod antlat HK, sev apical HK.   LBBB (left bundle branch block)    MI (myocardial infarction) (HCC) 02/2014    Past Surgical  History:  Procedure Laterality Date   CARDIAC CATHETERIZATION  03/16/2014   x1 stent   CARPAL TUNNEL RELEASE     MOHS SURGERY     SHOULDER SURGERY     TONSILLECTOMY     TOTAL ABDOMINAL HYSTERECTOMY     TOTAL KNEE ARTHROPLASTY Left     Family History  Problem Relation Age of Onset   Heart attack Father 60   Heart attack Brother 35   Cancer Maternal Grandmother    Skin cancer Other     Social History:  reports that she has never smoked. She has never used smokeless tobacco. She reports that she does not drink alcohol and does not use drugs.  Allergies:  Allergies  Allergen Reactions   Amlodipine  Other (See Comments) and Palpitations   Norvasc  [Amlodipine  Besylate]     Shakes     Statins Other (See Comments)    Myalgia Crestor and Atorvastatin    Sulfa Antibiotics Itching   Tape    Codeine Other (See Comments) and Rash    hallucinations    Prior to Admission medications   Medication Sig Start Date End Date Taking? Authorizing Provider  albuterol  (PROVENTIL  HFA;VENTOLIN  HFA) 108 (90 BASE) MCG/ACT inhaler Inhale 2 puffs into the lungs every 6 (six) hours as needed for wheezing or shortness of breath. 05/07/14  Yes Wenona Hamilton, MD  aspirin  EC 81 MG tablet Take 1 tablet (81 mg total) by mouth daily. Swallow whole. 12/22/22  Yes Roark Chick, PA-C  b complex vitamins capsule Take 1 capsule by mouth daily.   Yes [provider]  carvedilol  (COREG ) 3.125 MG tablet TAKE (1) TABLET BY MOUTH TWICE DAILY WITH A MEAL 03/15/23  Yes Hammock, Sheri, NP  diazepam (VALIUM) 5 MG tablet Take 2.5 mg by mouth at bedtime as needed. prn 11/17/21  Yes [provider]  gabapentin (NEURONTIN) 100 MG capsule Take 200 mg by mouth at bedtime. 06/02/21  Yes [provider]  levothyroxine (SYNTHROID, LEVOTHROID) 100 MCG tablet Take 100 mcg by mouth daily.   Yes [provider]  lisinopril  (ZESTRIL ) 5 MG tablet Take 1 tablet (5 mg total) by mouth in the morning and at  bedtime. 07/27/23  Yes Dunn, Elvia Hammans, PA-C  ondansetron  (ZOFRAN -ODT) 4 MG disintegrating tablet Take 1 tablet (4 mg total) by mouth every 8 (eight) hours as needed for nausea or vomiting. 11/19/21  Yes Luke Salaam, MD  UNABLE TO FIND Take 1 tablet by mouth daily. Standard Process A-F Beta food   Yes [provider]  valACYclovir (VALTREX) 1000 MG tablet Take 4,000 mg by mouth once. 11/05/21  Yes [provider]  LORazepam (ATIVAN) 0.5 MG tablet Take by mouth. Patient not taking: Reported on 04/21/2023 04/07/23   [provider]  melatonin 5 MG TABS Take 2 mg by mouth at bedtime.    [provider]  nitroGLYCERIN (NITROSTAT) 0.4 MG SL tablet Place 0.4 mg under the tongue every 5 (five) minutes as needed for chest pain. Patient not taking: Reported on 04/21/2023    [provider]    Blood pressure (!) 163/72, pulse 70, SpO2 95%. Exam: General: Communicates without difficulty, well nourished, no acute distress. Head: Normocephalic, no evidence injury, no tenderness, facial buttresses intact without stepoff. Face/sinus: No tenderness to palpation and percussion. Facial movement is normal and symmetric. Eyes: PERRL, EOMI. No scleral icterus, conjunctivae clear. Neuro: CN II exam reveals vision grossly intact.  No nystagmus at any point of gaze. Ears: Auricles well formed without lesions.  Ear canals are intact without mass or lesion.  No erythema or edema is appreciated.  The TMs are intact without fluid. Nose: External evaluation reveals normal support and skin without lesions.  Dorsum is intact.  Anterior rhinoscopy reveals congested mucosa over anterior aspect of inferior turbinates and intact septum.  No purulence noted. Oral:  Oral cavity and oropharynx are intact, symmetric, without erythema or edema.  Mucosa is moist without lesions. Neck: Full range of motion without pain.  There is no significant lymphadenopathy.  No masses palpable.  Thyroid bed within normal  limits to palpation.  Parotid glands and submandibular glands equal bilaterally without mass.  Trachea is midline. Neuro:  CN 2-12 grossly intact. Vestibular: No nystagmus at any point of gaze. Dix Hallpike negative.  Vestibular: There is no nystagmus with pneumatic pressure on either tympanic membrane or Valsalva. The cerebellar examination is unremarkable.   Assessment: 1.  Chronic rhinitis with moderate nasal mucosal congestion. 2.  Clinical right ear eustachian tube dysfunction. 3.  Recurrent dizziness of unknown etiology. The possible differential diagnoses include transient BPPV, vestibular migraine, Meniere's disease, peripheral vestibular dysfunction, or other central/systemic causes.  4.  The patient's ear canals, tympanic membranes, and middle ear spaces are normal.  No middle ear effusion is noted.  Her Dix-Hallpike maneuver is negative.  Plan: 1.  The physical exam findings are reviewed with the patient and her daughter. 2.  Flonase nasal spray 2 sprays each  nostril daily.  The importance of consistent daily use is discussed. 3.  Valsalva exercise multiple times a day. 4.  The pathophysiology of vestibular dysfunction and dizziness are discussed extensively with the patient. The possible differential diagnoses are reviewed. Questions are invited and answered.  5.  The patient will likely benefit from undergoing physical therapy/vestibular rehabilitation to improve her balancing function. A referral will be arranged as soon as possible.  6.  If the patient continues to be symptomatic, she may benefit from vestibular neurodiagnostic testing at a tertiary care center to evaluate for possible vestibular dysfunction.   Naila Elizondo W Kato Wieczorek 08/31/2023, 10:32 AM

## 2023-09-01 ENCOUNTER — Ambulatory Visit: Admission: RE | Admit: 2023-09-01 | Source: Ambulatory Visit

## 2023-09-06 MED ORDER — CARVEDILOL 3.125 MG PO TABS: ORAL_TABLET | ORAL | 0 refills | Status: DC

## 2023-10-11 NOTE — Therapy (Signed)
 OUTPATIENT PHYSICAL THERAPY VESTIBULAR EVALUATION  Patient Name: Kendra Prince MRN: [id number] DOB:Nov 12, 1931, 89 y.o., female Today's Date: 10/13/2023  END OF SESSION:  PT End of Session - 10/12/23 1442     Visit Number 1    Number of Visits 9    Date for PT Re-Evaluation 12/07/23    Authorization Type eval: 10/12/23    PT Start Time 1445    PT Stop Time 1530    PT Time Calculation (min) 45 min    Activity Tolerance Patient tolerated treatment well    Behavior During Therapy [facility] for tasks assessed/performed         Past Medical History:  Diagnosis Date   Chronic systolic heart failure (HCC)    a. 02/2014 LV gram: EF 40%, mod antlat HK, sev apical HK; b. 03/2017 MV: EF 57%; c. 11/2018 Echo: EF 45-50%, impaired relaxation. Nl RV size/fxn. Mildly dil LA. Mod elev PASP. Asc Ao 36mm.   Coronary artery disease    a. 02/2014 NSTEMI/PCI: LM nl, LAD 46m (2.5x33 Xience Alpine DES), LCX 50d, OM1/2 nl, RCA nl, RPDA nl, RPL1 nl, EF 40%; b. 03/2017 MV: EF 57%. Fixed apical and apical septal defect - MI vs attenuation. No ischemia.   Diabetes mellitus without complication (HCC)    Hyperlipidemia    a. Statin intolerant; b. Could not afford Zetia .   Hypertension    Hypothyroidism    Ischemic cardiomyopathy    a. 02/2014 LV gram: EF 40%, mod antlat HK, sev apical HK.   LBBB (left bundle branch block)    MI (myocardial infarction) (HCC) 02/2014   Past Surgical History:  Procedure Laterality Date   CARDIAC CATHETERIZATION  03/16/2014   x1 stent   CARPAL TUNNEL RELEASE     MOHS SURGERY     SHOULDER SURGERY     TONSILLECTOMY     TOTAL ABDOMINAL HYSTERECTOMY     TOTAL KNEE ARTHROPLASTY Left    Patient Active Problem List   Diagnosis Date Noted   Dizziness 08/31/2023   Chronic rhinitis 08/31/2023   Abnormal bruising 12/04/2022   Polyneuropathy associated with underlying disease (HCC) 10/05/2019   Gastroesophageal reflux disease 11/15/2018   Hernia, hiatal 11/15/2018   Other  specified disorders of eustachian tube, right ear 05/17/2018   Osteoporosis, post-menopausal 01/03/2018   Statin intolerance 06/26/2016   Melanoma of knee (HCC) 11/07/2015   Arthritis involving multiple sites 08/29/2014   Bilateral plantar fasciitis 08/29/2014   Benign positional vertigo, unspecified laterality 07/31/2014   History of MI (myocardial infarction) 07/31/2014   Type 2 diabetes mellitus with diabetic neuropathy (HCC) 07/31/2014   Acute non-ST-elevation myocardial infarction (HCC) 06/29/2014   Diabetes mellitus (HCC) 06/29/2014   Myocardial infarction (HCC) 06/29/2014   Pneumonia, organism unspecified(486) 05/10/2014   Mild intermittent asthma with acute exacerbation 05/08/2014   Primary osteoarthritis of left foot 04/27/2014   Hyperlipidemia    Chronic systolic heart failure (HCC)    Coronary artery disease    Acquired hypothyroidism 01/08/2014   Essential hypertension 01/08/2014   Type 2 diabetes mellitus without complication (HCC) 01/08/2014   PCP: Trudy Dorn Lunger, MD  REFERRING PROVIDER: Karis Clunes, MD   REFERRING DIAG: R42 (ICD-10-CM) - Dizziness  RATIONALE FOR EVALUATION AND TREATMENT: Rehabilitation  THERAPY DIAG: Dizziness and giddiness  ONSET DATE: Multiple years, unsure exact start date  FOLLOW-UP APPT SCHEDULED WITH REFERRING PROVIDER: Yes    SUBJECTIVE:   Chief Complaint:  Unsteadiness;  Pertinent History Pt complains of chronic recurrent dizziness/unsteadiness for multiple years.  She reports a history of intermittent positional vertigo but not currently. Pt has a history of chronic R sided hearing loss. She has a history of peripheral neuropathy. She has been using Flonase recently for eustachian tube dysfunction. She reports that last year her BP was too low and they had to adjust her medications which helped her symptoms. She sees a chiropractor for decompression therapy and believes that when he works on her neck it improves her balance.  She has also been using red light therapy. Her daughter is a massage therapist and will occasionally utilize massage with her to decrease myofascial pain. PMH includes diabetes, chronic heart failure, coronary artery disease, and myocardial infarction. She underwent a stent placement and has had no further issues.  She has no previous ENT surgery except for childhood tonsillectomy. Pt denies prior VNG study.  Per ENT note the possible differential diagnoses for her dizziness include transient BPPV, vestibular migraine, Meniere's disease, peripheral vestibular dysfunction, or other central/systemic causes. MRI was ordered however due to anxiety related to noise and confinement during procedure she cancelled.    Description of dizziness: Unsteadiness; Frequency: Constant Duration: Constant Symptom nature: constant and spontaneous Progression of symptoms since onset: better History of similar episodes: Yes  Provocative Factors: turning quickly, positional Easing Factors: Dramamine  Auditory complaints (tinnitus, pain, drainage, hearing loss, aural fullness): Yes, intermittent tinnitus bilaterally, chronic R hearing loss, R sided aural fullness, occasional R ear pain, no drainage Vision changes (diplopia, visual field loss, recent changes, recent eye exam): No, wears glasses, last optometry appointment 2023 Chest pain/palpitations: No, Hx of MI 2015 with stent, CHF with normal EF per pt; History of head injury/concussion: No Stress/anxiety: No, pt reports being an anxious person but denies any specific recent increase in stress. Migraines/headaches: No, infrequent small headaches. No prior history of migraines, she reports a history of intermittent visual aura without headache; Nausea/vomiting: No Numbness/tingling: Yes, hx of stocking distribution neuropathy BLE to just below the knees. She also reports numbness in her finger tips Focal weakness: No Dysarthria/dysphagia/drop attacks: No  Has  patient fallen in last 6 months? No Pertinent pain: Yes, neck and low back pain; Dominant hand: right Imaging: No, brain MRI cancelled; Prior level of function: Independent Occupational demands: Retired  PRECAUTIONS: Fall  WEIGHT BEARING RESTRICTIONS No  LIVING ENVIRONMENT: Lives with: lives with their daughter Lives in: House/apartment, one level Stairs: 3 steps to enter, L railing Has following equipment at home: single point cane, wheelchair, elevated commodes, shower seat;  PATIENT GOALS Improve symptoms   OBJECTIVE EXAMINATION  POSTURE: No gross deficits contributing to symptoms  NEUROLOGICAL SCREEN: (2+ unless otherwise noted.) N=normal  Ab=abnormal  Level Dermatome R L Myotome R L Reflex R L  C3 Anterior Neck N N Sidebend C2-3 N N Jaw CN V    C4 Top of Shoulder N N Shoulder Shrug C4 N N Hoffman's UMN    C5 Lateral Upper Arm N N Shoulder ABD C4-5 N N Biceps C5-6    C6 Lateral Arm/ Thumb N N Arm Flex/ Wrist Ext C5-6 N N Brachiorad. C5-6    C7 Middle Finger N N Arm Ext//Wrist Flex C6-7 N N Triceps C7    C8 4th & 5th Finger N N Flex/ Ext Carpi Ulnaris C8 N N Patellar (L3-4)    T1 Medial Arm N N Interossei T1 N N Gastrocnemius    L2 Medial thigh/groin N N Illiopsoas (L2-3) N N     L3 Lower thigh/med.knee N N Quadriceps (L3-4)  N N     L4 Medial leg/lat thigh N N Tibialis Ant (L4-5) N N     L5 Lat. leg & dorsal foot N N EHL (L5) N N     S1 post/lat foot/thigh/leg N N Gastrocnemius (S1-2) N N     S2 Post./med. thigh & leg N N Hamstrings (L4-S3) N N      CRANIAL NERVES II, III, IV, VI: Pupils equal and reactive to light, visual acuity and visual fields are intact, extraocular muscles are intact  V: Facial sensation is intact and symmetric bilaterally  VII: Facial strength is intact and symmetric bilaterally  VIII: Hearing is normal as tested by gross conversation IX, X: Palate elevates midline, normal phonation, uvula midline XI: Shoulder shrug strength is intact  XII:  Tongue protrudes midline   COORDINATION Finger to Nose: Normal Heel to Shin: Normal Pronator Drift: Negative Rapid Alternating Movements: Normal Finger to Thumb Opposition: Normal   RANGE OF MOTION Cervical Spine limited and stiff in all planes. No functional focal deficits in AROM noted in BUE/BLE with the exception of some limited bilateral shoulder flexion  MANUAL MUSCLE TESTING BUE/BLE strength WNL without focal deficits  TRANSFERS/GAIT Independent for transfers and ambulation without assistive device   PATIENT SURVEYS ABC: To be completed DHI: To be completed  OCULOMOTOR / VESTIBULAR TESTING  Oculomotor Exam- Room Light  Findings Comments  Ocular Alignment normal   Ocular ROM normal   Spontaneous Nystagmus normal   Gaze-Holding Nystagmus normal   End-Gaze Nystagmus normal   Vergence (normal 2-3) not examined   Smooth Pursuit normal   Cross-Cover Test not examined   Saccades normal   VOR Cancellation normal   Left Head Impulse normal   Right Head Impulse normal   Static Acuity not examined   Dynamic Acuity not examined    Oculomotor Exam- Fixation Suppressed: Deferred  BPPV TESTS:  Symptoms Duration Intensity Nystagmus  L Dix-Hallpike None   None  R Dix-Hallpike None   None  L Head Roll None   None  R Head Roll None   None  L Sidelying Test      R Sidelying Test      (blank = not tested)  Clinical Test of Sensory Interaction for Balance (CTSIB): Deferred  FUNCTIONAL OUTCOME MEASURES  Results Comments  BERG    DGI    FGA    TUG    5TSTS    6 Minute Walk Test    10 Meter Gait Speed    (blank = not tested)   TODAY'S TREATMENT  Deferred   PATIENT EDUCATION:  Education details: Plan of care and examination findings Person educated: Patient and daughter Education method: Explanation Education comprehension: verbalized understanding   HOME EXERCISE PROGRAM:  None currently   ASSESSMENT: CLINICAL IMPRESSION: Patient is a 89 y.o.  female who was seen today for physical therapy evaluation and treatment for dizziness/unsteadiness. No significant findings today during examination suggestive of vestibular dysfunction but additional testing will be performed at first follow-up visit.    OBJECTIVE IMPAIRMENTS: decreased balance and dizziness.   ACTIVITY LIMITATIONS: standing and stairs  PARTICIPATION LIMITATIONS: meal prep, cleaning, shopping, and community activity  PERSONAL FACTORS: Age, Past/current experiences, Time since onset of injury/illness/exacerbation, and 1-2 comorbidities: CHF and peripheral neuropathy are also affecting patient's functional outcome.   REHAB POTENTIAL: Good  CLINICAL DECISION MAKING: Evolving/moderate complexity  EVALUATION COMPLEXITY: Moderate   GOALS:  SHORT TERM GOALS: Target date: 11/09/2023  Pt will be independent with HEP  for dizziness in order to decrease symptoms, improve balance,decrease fall risk, and improve function at home. Baseline: Goal status: INITIAL   LONG TERM GOALS: Target date: 12/07/2023  Pt will improve BERG by at least 3 points in order to demonstrate clinically significant improvement in balance.   Baseline: To be completed Goal status: INITIAL  2.  Pt will decrease DHI score by at least 18 points in order to demonstrate clinically significant reduction in disability related to dizziness.  Baseline: To be completed Goal status: INITIAL  3.  Pt will improve ABC by at least 13% in order to demonstrate clinically significant improvement in balance confidence.      Baseline: To be completed Goal status: INITIAL  4. Pt will decrease 5TSTS by at least 3 seconds in order to demonstrate clinically significant improvement in LE strength      Baseline: To be completed Goal status: INITIAL  5. Pt will improve DGI by at least 3 points in order to demonstrate clinically significant improvement in balance and decreased risk for falls.     Baseline: To be  completed Goal status: INITIAL   PLAN: PT FREQUENCY: 1x/week  PT DURATION: 8 weeks  PLANNED INTERVENTIONS: Therapeutic exercises, Therapeutic activity, Neuromuscular re-education, Balance training, Gait training, Patient/Family education, Self Care, Joint mobilization, Joint manipulation, Vestibular training, Canalith repositioning, Orthotic/Fit training, DME instructions, Dry Needling, Electrical stimulation, Spinal manipulation, Spinal mobilization, Cryotherapy, Moist heat, Taping, Traction, Ultrasound, Ionotophoresis 4mg /ml Dexamethasone, Manual therapy, and Re-evaluation.  PLAN FOR NEXT SESSION: pt to complete ABC and DHI, fixation suppression testing, mCTSIB, BERG, 5TSTS, DGI, consider DVA   Selinda BIRCH Brantley Naser PT, DPT, GCS  Geneve Kimpel, PT 10/13/2023, 10:45 AM

## 2023-10-12 ENCOUNTER — Ambulatory Visit: Attending: Otolaryngology

## 2023-10-12 DIAGNOSIS — R42 Dizziness and giddiness: Secondary | ICD-10-CM | POA: Insufficient documentation

## 2023-10-18 NOTE — Therapy (Signed)
 OUTPATIENT PHYSICAL THERAPY VESTIBULAR TREATMENT  Patient Name: Kendra Prince MRN: [id number] DOB:1932-03-06, 89 y.o., female Today's Date: 10/21/2023  END OF SESSION:  PT End of Session - 10/21/23 0941     Visit Number 2    Number of Visits 9    Date for PT Re-Evaluation 12/07/23    Authorization Type eval: 10/12/23    PT Start Time 1450    PT Stop Time 1530    PT Time Calculation (min) 40 min    Activity Tolerance Patient tolerated treatment well    Behavior During Therapy [facility] for tasks assessed/performed         Past Medical History:  Diagnosis Date   Chronic systolic heart failure (HCC)    a. 02/2014 LV gram: EF 40%, mod antlat HK, sev apical HK; Kendra. 03/2017 MV: EF 57%; c. 11/2018 Echo: EF 45-50%, impaired relaxation. Nl RV size/fxn. Mildly dil LA. Mod elev PASP. Asc Ao 36mm.   Coronary artery disease    a. 02/2014 NSTEMI/PCI: LM nl, LAD 65m (2.5x33 Xience Alpine DES), LCX 50d, OM1/2 nl, RCA nl, RPDA nl, RPL1 nl, EF 40%; Kendra. 03/2017 MV: EF 57%. Fixed apical and apical septal defect - MI vs attenuation. No ischemia.   Diabetes mellitus without complication (HCC)    Hyperlipidemia    a. Statin intolerant; Kendra. Could not afford Zetia .   Hypertension    Hypothyroidism    Ischemic cardiomyopathy    a. 02/2014 LV gram: EF 40%, mod antlat HK, sev apical HK.   LBBB (left bundle branch block)    MI (myocardial infarction) (HCC) 02/2014   Past Surgical History:  Procedure Laterality Date   CARDIAC CATHETERIZATION  03/16/2014   x1 stent   CARPAL TUNNEL RELEASE     MOHS SURGERY     SHOULDER SURGERY     TONSILLECTOMY     TOTAL ABDOMINAL HYSTERECTOMY     TOTAL KNEE ARTHROPLASTY Left    Patient Active Problem List   Diagnosis Date Noted   Dizziness 08/31/2023   Chronic rhinitis 08/31/2023   Abnormal bruising 12/04/2022   Polyneuropathy associated with underlying disease (HCC) 10/05/2019   Gastroesophageal reflux disease 11/15/2018   Hernia, hiatal 11/15/2018   Other specified  disorders of eustachian tube, right ear 05/17/2018   Osteoporosis, post-menopausal 01/03/2018   Statin intolerance 06/26/2016   Melanoma of knee (HCC) 11/07/2015   Arthritis involving multiple sites 08/29/2014   Bilateral plantar fasciitis 08/29/2014   Benign positional vertigo, unspecified laterality 07/31/2014   History of MI (myocardial infarction) 07/31/2014   Type 2 diabetes mellitus with diabetic neuropathy (HCC) 07/31/2014   Acute non-ST-elevation myocardial infarction (HCC) 06/29/2014   Diabetes mellitus (HCC) 06/29/2014   Myocardial infarction (HCC) 06/29/2014   Pneumonia, organism unspecified(486) 05/10/2014   Mild intermittent asthma with acute exacerbation 05/08/2014   Primary osteoarthritis of left foot 04/27/2014   Hyperlipidemia    Chronic systolic heart failure (HCC)    Coronary artery disease    Acquired hypothyroidism 01/08/2014   Essential hypertension 01/08/2014   Type 2 diabetes mellitus without complication (HCC) 01/08/2014   PCP: Trudy Dorn Lunger, MD  REFERRING PROVIDER: Karis Clunes, MD   REFERRING DIAG: R42 (ICD-10-CM) - Dizziness  RATIONALE FOR EVALUATION AND TREATMENT: Rehabilitation  THERAPY DIAG: Dizziness and giddiness  ONSET DATE: Multiple years, unsure exact start date  FOLLOW-UP APPT SCHEDULED WITH REFERRING PROVIDER: Yes   FROM INITIAL EVALUATION SUBJECTIVE:   Chief Complaint:  Unsteadiness;  Pertinent History Pt complains of chronic recurrent dizziness/unsteadiness for  multiple years. She reports a history of intermittent positional vertigo but not currently. Pt has a history of chronic R sided hearing loss. She has a history of peripheral neuropathy. She has been using Flonase recently for eustachian tube dysfunction. She reports that last year her BP was too low and they had to adjust her medications which helped her symptoms. She sees a chiropractor for decompression therapy and believes that when he works on her neck it improves  her balance. She has also been using red light therapy. Her daughter is a massage therapist and will occasionally utilize massage with her to decrease myofascial pain. PMH includes diabetes, chronic heart failure, coronary artery disease, and myocardial infarction. She underwent a stent placement and has had no further issues.  She has no previous ENT surgery except for childhood tonsillectomy. Pt denies prior VNG study.  Per ENT note the possible differential diagnoses for her dizziness include transient BPPV, vestibular migraine, Meniere's disease, peripheral vestibular dysfunction, or other central/systemic causes. MRI was ordered however due to anxiety related to noise and confinement during procedure she cancelled.    Description of dizziness: Unsteadiness; Frequency: Constant Duration: Constant Symptom nature: constant and spontaneous Progression of symptoms since onset: better History of similar episodes: Yes  Provocative Factors: turning quickly, positional Easing Factors: Dramamine  Auditory complaints (tinnitus, pain, drainage, hearing loss, aural fullness): Yes, intermittent tinnitus bilaterally, chronic R hearing loss, R sided aural fullness, occasional R ear pain, no drainage Vision changes (diplopia, visual field loss, recent changes, recent eye exam): No, wears glasses, last optometry appointment 2023 Chest pain/palpitations: No, Hx of MI 2015 with stent, CHF with normal EF per pt; History of head injury/concussion: No Stress/anxiety: No, pt reports being an anxious person but denies any specific recent increase in stress. Migraines/headaches: No, infrequent small headaches. No prior history of migraines, she reports a history of intermittent visual aura without headache; Nausea/vomiting: No Numbness/tingling: Yes, hx of stocking distribution neuropathy BLE to just below the knees. She also reports numbness in her finger tips Focal weakness: No Dysarthria/dysphagia/drop attacks:  No  Has patient fallen in last 6 months? No Pertinent pain: Yes, neck and low back pain; Dominant hand: right Imaging: No, brain MRI cancelled; Prior level of function: Independent Occupational demands: Retired  PRECAUTIONS: Fall  WEIGHT BEARING RESTRICTIONS No  LIVING ENVIRONMENT: Lives with: lives with their daughter Lives in: House/apartment, one level Stairs: 3 steps to enter, L railing Has following equipment at home: single point cane, wheelchair, elevated commodes, shower seat;  PATIENT GOALS Improve symptoms   OBJECTIVE EXAMINATION  POSTURE: No gross deficits contributing to symptoms  NEUROLOGICAL SCREEN: (2+ unless otherwise noted.) N=normal  Ab=abnormal  Level Dermatome R L Myotome R L Reflex R L  C3 Anterior Neck N N Sidebend C2-3 N N Jaw CN V    C4 Top of Shoulder N N Shoulder Shrug C4 N N Hoffman's UMN    C5 Lateral Upper Arm N N Shoulder ABD C4-5 N N Biceps C5-6    C6 Lateral Arm/ Thumb N N Arm Flex/ Wrist Ext C5-6 N N Brachiorad. C5-6    C7 Middle Finger N N Arm Ext//Wrist Flex C6-7 N N Triceps C7    C8 4th & 5th Finger N N Flex/ Ext Carpi Ulnaris C8 N N Patellar (L3-4)    T1 Medial Arm N N Interossei T1 N N Gastrocnemius    L2 Medial thigh/groin N N Illiopsoas (L2-3) N N     L3 Lower thigh/med.knee N N  Quadriceps (L3-4) N N     L4 Medial leg/lat thigh N N Tibialis Ant (L4-5) N N     L5 Lat. leg & dorsal foot N N EHL (L5) N N     S1 post/lat foot/thigh/leg N N Gastrocnemius (S1-2) N N     S2 Post./med. thigh & leg N N Hamstrings (L4-S3) N N      CRANIAL NERVES II, III, IV, VI: Pupils equal and reactive to light, visual acuity and visual fields are intact, extraocular muscles are intact  V: Facial sensation is intact and symmetric bilaterally  VII: Facial strength is intact and symmetric bilaterally  VIII: Hearing is normal as tested by gross conversation IX, X: Palate elevates midline, normal phonation, uvula midline XI: Shoulder shrug strength is  intact  XII: Tongue protrudes midline   COORDINATION Finger to Nose: Normal Heel to Shin: Normal Pronator Drift: Negative Rapid Alternating Movements: Normal Finger to Thumb Opposition: Normal   RANGE OF MOTION Cervical Spine limited and stiff in all planes. No functional focal deficits in AROM noted in BUE/BLE with the exception of some limited bilateral shoulder flexion  MANUAL MUSCLE TESTING BUE/BLE strength WNL without focal deficits  TRANSFERS/GAIT Independent for transfers and ambulation without assistive device   PATIENT SURVEYS ABC: To be completed DHI: To be completed  OCULOMOTOR / VESTIBULAR TESTING  Oculomotor Exam- Room Light  Findings Comments  Ocular Alignment normal   Ocular ROM normal   Spontaneous Nystagmus normal   Gaze-Holding Nystagmus normal   End-Gaze Nystagmus normal   Vergence (normal 2-3) not examined   Smooth Pursuit normal   Cross-Cover Test not examined   Saccades normal   VOR Cancellation normal   Left Head Impulse normal   Right Head Impulse normal   Static Acuity not examined   Dynamic Acuity not examined    Oculomotor Exam- Fixation Suppressed: Deferred  BPPV TESTS:  Symptoms Duration Intensity Nystagmus  L Dix-Hallpike None   None  R Dix-Hallpike None   None  L Head Roll None   None  R Head Roll None   None  L Sidelying Test      R Sidelying Test      (blank = not tested)  Clinical Test of Sensory Interaction for Balance (CTSIB): Deferred  FUNCTIONAL OUTCOME MEASURES  Results Comments  BERG    DGI    FGA    TUG    5TSTS    6 Minute Walk Test    10 Meter Gait Speed    (blank = not tested)   TODAY'S TREATMENT    SUBJECTIVE: Pt reports that She is doing well today. No changes since the initial evaluation. Denies pertinent pain. No specific questions or concerns.    PAIN: Neck pain   Neuromuscular Re-education   Oculomotor Exam- Fixation Suppressed  Findings Comments  Ocular Alignment normal    Spontaneous Nystagmus normal   Gaze-Holding Nystagmus normal   End-Gaze Nystagmus normal   Head Shaking Nystagmus normal   Pressure-Induced Nystagmus normal   Hyperventilation Induced Nystagmus not examined   Skull Vibration Induced Nystagmus abnormal Pure horizontal beating nystagmus with mastoid stimulation bilaterally;    Clinical Test of Sensory Interaction for Balance    (CTSIB):  CONDITION TIME  Eyes open, firm surface 30 seconds  Eyes closed, firm surface 30 seconds  Eyes open, foam surface 30 seconds  Eyes closed, foam surface 2 seconds    FUNCTIONAL OUTCOME MEASURES  10/19/23 Comments  BERG 45/56   DGI 17/24  FGA    TUG 15.8s   5TSTS 21.3s   6 Minute Walk Test    10 Meter Gait Speed    (blank = not tested)   PATIENT EDUCATION:  Education details: Plan of care and examination findings Person educated: Patient and daughter Education method: Explanation Education comprehension: verbalized understanding   HOME EXERCISE PROGRAM:  None currently   ASSESSMENT: CLINICAL IMPRESSION: Performed additional examination items during session today. She presents with a pure horizontal beating nystagmus with skull vibration indicative of possible UVH. Modified CTSIB is positive for LOB after 2 seconds during condition 4. Moderate to high risk for falls as indicated by BERG of 45/56, DGI 17/24, and 5TSTS of 21.3s. Pt and daughter educated about use of at minimum a single point cane. Pt encouraged to bring her cane to the next visit in order to size and practice. Will initiate balance and strength exercises and will consider gaze stabilization exercises if she can tolerate with her neck pain. Pt encouraged to follow up as scheduled. She will benefit from PT services to address deficits in strength, balance, dizziness, and mobility in order to improve function at home and decrease her risk for falls.   OBJECTIVE IMPAIRMENTS: decreased balance and dizziness.   ACTIVITY  LIMITATIONS: standing and stairs  PARTICIPATION LIMITATIONS: meal prep, cleaning, shopping, and community activity  PERSONAL FACTORS: Age, Past/current experiences, Time since onset of injury/illness/exacerbation, and 1-2 comorbidities: CHF and peripheral neuropathy are also affecting patient's functional outcome.   REHAB POTENTIAL: Good  CLINICAL DECISION MAKING: Evolving/moderate complexity  EVALUATION COMPLEXITY: Moderate   GOALS:  SHORT TERM GOALS: Target date: 11/09/2023  Pt will be independent with HEP for dizziness in order to decrease symptoms, improve balance,decrease fall risk, and improve function at home. Baseline: Goal status: INITIAL   LONG TERM GOALS: Target date: 12/07/2023  Pt will improve BERG by at least 3 points in order to demonstrate clinically significant improvement in balance.   Baseline: 10/19/23: 45/56 Goal status: INITIAL  2.  Pt will decrease DHI score by at least 18 points in order to demonstrate clinically significant reduction in disability related to dizziness.  Baseline: To be completed Goal status: INITIAL  3.  Pt will improve ABC by at least 13% in order to demonstrate clinically significant improvement in balance confidence.      Baseline: To be completed Goal status: INITIAL  4. Pt will decrease 5TSTS by at least 3 seconds in order to demonstrate clinically significant improvement in LE strength      Baseline: 10/19/23: 21.3s Goal status: INITIAL  5. Pt will improve DGI by at least 3 points in order to demonstrate clinically significant improvement in balance and decreased risk for falls.     Baseline: 10/19/23: 17/24 Goal status: INITIAL   PLAN: PT FREQUENCY: 1x/week  PT DURATION: 8 weeks  PLANNED INTERVENTIONS: Therapeutic exercises, Therapeutic activity, Neuromuscular re-education, Balance training, Gait training, Patient/Family education, Self Care, Joint mobilization, Joint manipulation, Vestibular training, Canalith  repositioning, Orthotic/Fit training, DME instructions, Dry Needling, Electrical stimulation, Spinal manipulation, Spinal mobilization, Cryotherapy, Moist heat, Taping, Traction, Ultrasound, Ionotophoresis 4mg /ml Dexamethasone, Manual therapy, and Re-evaluation.  PLAN FOR NEXT SESSION: pt to complete ABC and DHI, consider DVA, initiate balance and strength exercises, consider gaze stabilization if she is able to tolerate with her neck pain;   Selinda BIRCH Chera Slivka PT, DPT, GCS  Gaynor Genco, PT 10/21/2023, 9:47 AM

## 2023-10-19 ENCOUNTER — Ambulatory Visit

## 2023-10-19 DIAGNOSIS — R42 Dizziness and giddiness: Secondary | ICD-10-CM | POA: Diagnosis not present

## 2023-10-26 ENCOUNTER — Ambulatory Visit

## 2023-11-02 ENCOUNTER — Encounter

## 2023-11-09 ENCOUNTER — Encounter

## 2023-11-16 ENCOUNTER — Encounter

## 2023-12-20 ENCOUNTER — Other Ambulatory Visit: Payer: Self-pay | Admitting: Cardiology

## 2024-03-05 NOTE — Progress Notes (Unsigned)
 Virtual Visit via Telephone Note   Because of Kendra Prince's co-morbid illnesses, she is at least at moderate risk for complications without adequate follow up.  This format is felt to be most appropriate for this patient at this time.  The patient did not have access to video technology/had technical difficulties with video requiring transitioning to audio format only (telephone).  All issues noted in this document were discussed and addressed.  No physical exam could be performed with this format.  Please refer to the patient's chart for her consent to telehealth for [facility].    Date:  03/06/2024   ID:  Kendra Prince, DOB 1931/08/18, MRN [id number] The patient was identified using 2 identifiers.  Patient Location: Home Provider Location: Office/Clinic   PCP:  Trudy Dorn Lunger, MD (Inactive)   [facility] Providers Cardiologist:  Deatrice Cage, MD {  Evaluation Performed:  Follow-Up Visit  Chief Complaint:  Follow up HTN  History of Present Illness:    Kendra Prince is a 89 y.o. female with CAD with NSTEMI in 02/2014 status post PCI/DES to the mid LAD, HFimpEF secondary to ICM, DM2, HTN, HLD with statin intolerance, LBBB, and hypothyroidism who presents for follow-up of hypertension.  She was admitted to the hospital in 02/2014 with an NSTEMI with anterior ischemic changes on EKG.  LHC showed 99% mid LAD stenosis treated successfully with PCI/DES.  EF of 40%.  Most recent ischemic evaluation in 03/2017 via Lexiscan  MPI which showed no evidence of ischemia with a normal EF of 57%.  Echo in 11/2018 showed an EF of 45 to 50% with moderate pulmonary hypertension with an RVSP of 46 mmHg.  Echo from 01/2022 showed an EF of 50 to 55% with mild pulmonary hypertension.     She underwent Mohs surgery of her hand in 10/2022.  Following this, she presented to the ER for evaluation of bleeding in her hand and associated hypotension with lightheadedness  felt to be vagal in etiology.  Hgb trended from 13.1 to 12.6 in the ER.  Has subsequently been evaluated by hematology.   She was seen in the office on 12/22/2022 and was without symptoms of angina or cardiac decompensation.  She was without further bleeding episodes.  Swelling along the right hand was improving.  Blood pressure at home range from the 90s to 1 teens systolic with an occasional reading in the 130s millimeter mercury systolic.  She was taking a half of a carvedilol  3.125 mg twice daily and lisinopril  10 mg.  She had not been taking aspirin .  She underwent echo on 01/16/2023 that showed an EF of 55 to 60%, no regional wall motion abnormalities, grade 1 diastolic dysfunction, normal RV systolic function and ventricular cavity size, mildly elevated RVSP estimated at 42.2 mmHg, mildly dilated left atrium, mild mitral regurgitation, mild to moderate tricuspid regurgitation, moderate aortic valve insufficiency with aortic valve sclerosis without evidence of stenosis, and an estimated right atrial pressure of 3 mmHg.  Carotid artery ultrasound in 12/2022 showed 1 to 39% right ICA stenosis with no evidence of stenosis in the left ICA, antegrade flow of the bilateral vertebral arteries, and normal flow hemodynamics of the bilateral subclavian arteries.  She was seen in the office in 01/2023 and remained without symptoms of angina or cardiac decompensation.  Overall blood pressures were reasonably controlled at home, taking a half tab of carvedilol  3.125 mg twice daily and lisinopril  5 mg at lunchtime.  She was drinking Liquid  IV 2 to 3 days/week.  Blood pressure was well-controlled in the office at 124/86.  She was transitioned from lisinopril  to losartan  due to dry cough.  Otherwise, she was continued on current dose of carvedilol .  Following transition to losartan  she noted difficulty sleeping with muscle spasms at night, arthralgias, and cough.  Losartan  was discontinued at that time with recommendation to  follow-up to trend blood pressure.  She was seen virtually in 03/2023 due to winter weather and reported improvement in myalgias, arthralgias, and cough following discontinuation of losartan .  Memory and functional status were also improved.  She was taking carvedilol  half a tablet 3.125 mg twice daily with well-controlled blood pressures in the 120s to 140s mmHg systolic.  Following this visit she reported some elevations in blood pressure, increased anxiety, and insomnia.  In the setting of persistent elevation in BP readings that she was restarted on lisinopril , ultimately at 5 mg twice daily with improvement in blood pressure and symptoms.  She was last seen in 07/2023 with well-controlled blood pressure noted at home.  There was also improvement in functional status, dizziness, and mental status with stabilization of BP.  Prior to today's appointment, home blood readings were sent in via MyChart with readings in the 160s to 170s mmHg systolic with recommendation to increase lisinopril  to 5 mg twice daily continuation of carvedilol  3.125 mg twice daily.  She is seen virtually today due to winter weather precipitation and recent GI illness.  She is without symptoms of angina or cardiac decompensation.  Following a dose increase of lisinopril , the patient and her daughter (per patient) have noted an improvement in the patient's energy level and overall functional status.  The patient also reports a sensation of shortness of breath that is improved following dosage increase of lisinopril .  The patient describes this as a sensation of hearing her self-exam.  Oxygen saturation 97 to 90% on room air.  No dizziness, presyncope, or syncope.  Good appetite.  No lower extremity swelling.   Labs independently reviewed: 06/2023 - Hgb 13.7, PLT 237, BUN 12, SCr 0.8, potassium 4.6, albumin 4.4, AST/ALT normal, TSH 6.25, T4 normal,  10/2022 - Hgb 12.3, PLT 290 06/2022 - TC 234, TG 181, HDL 48, LDL 153   Past Medical  History:  Diagnosis Date   Chronic systolic heart failure (HCC)    a. 02/2014 LV gram: EF 40%, mod antlat HK, sev apical HK; b. 03/2017 MV: EF 57%; c. 11/2018 Echo: EF 45-50%, impaired relaxation. Nl RV size/fxn. Mildly dil LA. Mod elev PASP. Asc Ao 36mm.   Coronary artery disease    a. 02/2014 NSTEMI/PCI: LM nl, LAD 21m (2.5x33 Xience Alpine DES), LCX 50d, OM1/2 nl, RCA nl, RPDA nl, RPL1 nl, EF 40%; b. 03/2017 MV: EF 57%. Fixed apical and apical septal defect - MI vs attenuation. No ischemia.   Diabetes mellitus without complication (HCC)    Hyperlipidemia    a. Statin intolerant; b. Could not afford Zetia .   Hypertension    Hypothyroidism    Ischemic cardiomyopathy    a. 02/2014 LV gram: EF 40%, mod antlat HK, sev apical HK.   LBBB (left bundle branch block)    MI (myocardial infarction) (HCC) 02/2014   Past Surgical History:  Procedure Laterality Date   CARDIAC CATHETERIZATION  03/16/2014   x1 stent   CARPAL TUNNEL RELEASE     MOHS SURGERY     SHOULDER SURGERY     TONSILLECTOMY     TOTAL ABDOMINAL HYSTERECTOMY  TOTAL KNEE ARTHROPLASTY Left      Current Meds  Medication Sig   albuterol  (PROVENTIL  HFA;VENTOLIN  HFA) 108 (90 BASE) MCG/ACT inhaler Inhale 2 puffs into the lungs every 6 (six) hours as needed for wheezing or shortness of breath.   aspirin  EC 81 MG tablet Take 1 tablet (81 mg total) by mouth daily. Swallow whole.   b complex vitamins capsule Take 1 capsule by mouth daily.   carvedilol  (COREG ) 3.125 MG tablet TAKE (1) TABLET BY MOUTH TWICE DAILY WITH A MEAL   levothyroxine (SYNTHROID, LEVOTHROID) 100 MCG tablet Take 100 mcg by mouth daily.   lisinopril  (ZESTRIL ) 5 MG tablet Take 1 tablet (5 mg total) by mouth in the morning and at bedtime.     Allergies:   Amlodipine , Norvasc  [amlodipine  besylate], Statins, Sulfa antibiotics, Tape, and Codeine   Social History   Tobacco Use   Smoking status: Never   Smokeless tobacco: Never  Vaping Use   Vaping status: Never  Used  Substance Use Topics   Alcohol use: No   Drug use: No     Family Hx: The patient's family history includes Cancer in her maternal grandmother; Heart attack (age of onset: 73) in her brother; Heart attack (age of onset: 10) in her father; Skin cancer in an other family member.  ROS:   Please see the history of present illness.     All other systems reviewed and are negative.   Prior CV studies:   The following studies were reviewed today:  2D echo 01/12/2023: 1. Left ventricular ejection fraction, by estimation, is 55 to 60%. Left  ventricular ejection fraction by 3D volume is 58 %. The left ventricle has  normal function. The left ventricle has no regional wall motion  abnormalities. Left ventricular diastolic   parameters are consistent with Grade I diastolic dysfunction (impaired  relaxation). The average left ventricular global longitudinal strain is  -18.2 %.   2. Right ventricular systolic function is normal. The right ventricular  size is normal. There is mildly elevated pulmonary artery systolic  pressure.   3. Left atrial size was mildly dilated.   4. The mitral valve is normal in structure. Mild mitral valve  regurgitation. No evidence of mitral stenosis.   5. Tricuspid valve regurgitation is mild to moderate.   6. The aortic valve is tricuspid. Aortic valve regurgitation is moderate.  Aortic valve sclerosis is present, with no evidence of aortic valve  stenosis.   7. The inferior vena cava is normal in size with greater than 50%  respiratory variability, suggesting right atrial pressure of 3 mmHg.  __________   Carotid artery ultrasound 01/04/2023: Summary:  Right Carotid: Velocities in the right ICA are consistent with a 1-39% stenosis.   Left Carotid: There is no evidence of stenosis in the left ICA.   Vertebrals:  Bilateral vertebral arteries demonstrate antegrade flow.  Subclavians: Normal flow hemodynamics were seen in bilateral subclavian arteries.   __________   2D echo 02/17/2022: 1. Left ventricular ejection fraction, by estimation, is 50 to 55%. The  left ventricle has low normal function. The left ventricle has no regional  wall motion abnormalities. There is mild left ventricular hypertrophy of  the basal-septal segment. Left  ventricular diastolic parameters are consistent with Grade I diastolic  dysfunction (impaired relaxation). The average left ventricular global  longitudinal strain is -12.5 %.   2. Right ventricular systolic function is normal. The right ventricular  size is normal. There is mildly elevated pulmonary artery  systolic  pressure. The estimated right ventricular systolic pressure is 39.6 mmHg.   3. The mitral valve is normal in structure. No evidence of mitral valve  regurgitation. No evidence of mitral stenosis.   4. The aortic valve is tricuspid. Aortic valve regurgitation is mild.  Aortic valve sclerosis is present, with no evidence of aortic valve  stenosis.   5. There is borderline dilatation of the ascending aorta, measuring 39  mm.   6. The inferior vena cava is normal in size with greater than 50%  respiratory variability, suggesting right atrial pressure of 3 mmHg.   Comparison(s): 12/22/18-EF 45-50%;  __________   2D echo 12/22/2018: 1. Left ventricular ejection fraction, by visual estimation, is 45 to  50%. The left ventricle has normal function. Normal left ventricular size.  There is mildly increased left ventricular hypertrophy.Unable to exclude  septal wall hypokinesis.   2. Left ventricular diastolic Doppler parameters are consistent with  impaired relaxation pattern of LV diastolic filling.   3. Global right ventricle has normal systolic function.The right  ventricular size is normal. No increase in right ventricular wall  thickness.   4. Moderately elevated pulmonary artery systolic pressure.   5. Left atrial size was mildly dilated.   6. There is very mild dilatation of the  ascending aorta measuring 36 mm.  __________   Lexiscan  MPI 04/14/2017: Pharmacological myocardial perfusion imaging study with no significant ischemia Small region of mild fixed apical and apical septal perfusion defect, consistent with previous MI vs attenuation artifact Normal wall motion, EF estimated at 57% No EKG changes concerning for ischemia at peak stress or in recovery. Resting EKG with IVCD Low risk scan __________   LHC 03/16/2014: Mid LAD 99% stenosis status post PCI/DES, distal CX 50% stenosis  Labs/Other Tests and Data Reviewed:    EKG:  An ECG dated 08/25/2023 was personally reviewed today and demonstrated:  NSR, 61 bpm, 1st degree AV block, LBBB  Recent Labs: No results found for requested labs within last 365 days.   Recent Lipid Panel Lab Results  Component Value Date/Time   CHOL 200 (H) 09/20/2015 09:28 AM   CHOL 145 03/16/2014 05:38 AM   TRIG 156 (H) 09/20/2015 09:28 AM   TRIG 173 03/16/2014 05:38 AM   HDL 44 09/20/2015 09:28 AM   HDL 40 03/16/2014 05:38 AM   CHOLHDL 4.5 (H) 09/20/2015 09:28 AM   LDLCALC 125 (H) 09/20/2015 09:28 AM   LDLCALC 70 03/16/2014 05:38 AM    Wt Readings from Last 3 Encounters:  08/25/23 136 lb 6.4 oz (61.9 kg)  04/21/23 134 lb (60.8 kg)  02/15/23 134 lb 9.6 oz (61.1 kg)     Risk Assessment/Calculations:          Objective:    Vital Signs:  BP (!) 136/58   Pulse 63    VITAL SIGNS:  reviewed  ASSESSMENT & PLAN:    CAD involving native coronary arteries without angina: She is doing well and without symptoms concerning for angina or cardiac decompensation. Continue aspirin  81 mg daily along with carvedilol  3.25 mg twice daily.   ICM/valvular heart disease: Most recent echo from 12/2023 showed normal LV systolic function.  Echo at that time demonstrated mild mitral regurgitation with mild to moderate tricuspid regurgitation and mildly elevated RVSP.  She remains on carvedilol  3.125 mg twice daily and lisinopril  5 mg  twice daily.  Not requiring a standing loop diuretic.  There was some increase in shortness of breath that has improved with  improvement in blood pressure readings following titration of lisinopril .  Recommend obtaining echo at next visit to evaluate for recurrence of cardiomyopathy, trend valvular heart disease, and reassess RVSP.  HTN: Blood pressure is improved following dose titration of lisinopril  to 5 mg twice daily with continuation of carvedilol  3.125 mg twice daily.  HLD with statin intolerance: LDL 153 in 06/2022.  Intolerant to all statins and ezetimibe .   Time:   Today, I have spent 10 minutes with the patient with telehealth technology discussing the above problems.     Medication Adjustments/Labs and Tests Ordered: Current medicines are reviewed at length with the patient today.  Concerns regarding medicines are outlined above.   Tests Ordered: No orders of the defined types were placed in this encounter.   Medication Changes: No orders of the defined types were placed in this encounter.   Follow Up:  In Person in 3 month(s)     Signed, Bernardino Bring, PA-C 03/06/2024 1:04 PM     [facility] [place] [address] [place], [place] [address] [phone]

## 2024-03-06 ENCOUNTER — Ambulatory Visit: Attending: Physician Assistant | Admitting: Physician Assistant

## 2024-03-06 VITALS — BP 136/58 | HR 63

## 2024-03-06 DIAGNOSIS — I2510 Atherosclerotic heart disease of native coronary artery without angina pectoris: Secondary | ICD-10-CM

## 2024-03-06 DIAGNOSIS — E785 Hyperlipidemia, unspecified: Secondary | ICD-10-CM

## 2024-03-06 DIAGNOSIS — I255 Ischemic cardiomyopathy: Secondary | ICD-10-CM

## 2024-03-06 DIAGNOSIS — Z789 Other specified health status: Secondary | ICD-10-CM

## 2024-03-06 DIAGNOSIS — I10 Essential (primary) hypertension: Secondary | ICD-10-CM

## 2024-03-06 NOTE — Patient Instructions (Signed)
 Medication Instructions:  Your physician recommends that you continue on your current medications as directed. Please refer to the Current Medication list given to you today.   *If you need a refill on your cardiac medications before your next appointment, please call your pharmacy*  Lab Work: None ordered at this time   Follow-Up: At [facility], you and your health needs are our priority.  As part of our continuing mission to provide you with exceptional heart care, our providers are all part of one team.  This team includes your primary Cardiologist (physician) and Advanced Practice Providers or APPs (Physician Assistants and Nurse Practitioners) who all work together to provide you with the care you need, when you need it.  Your next appointment:   3 month(s)  Provider:   You may see Deatrice Cage, MD or Bernardino Bring, PA-C

## 2024-03-20 ENCOUNTER — Other Ambulatory Visit: Payer: Self-pay | Admitting: Cardiology

## 2024-04-14 DIAGNOSIS — I255 Ischemic cardiomyopathy: Secondary | ICD-10-CM

## 2024-04-14 NOTE — Telephone Encounter (Signed)
 Please get patient scheduled for echo in the next couple of weeks and follow-up with me thereafter.  Diagnosis for echo is ischemic cardiomyopathy.

## 2024-04-17 NOTE — Telephone Encounter (Signed)
 ECHO order placed and message sent to scheduling.

## 2024-04-25 NOTE — Telephone Encounter (Signed)
 Per note Please schedule pt for and ECHO and follow up appointment after test with Ryan  Left message on voice mail to schedule appointments

## 2024-05-11 ENCOUNTER — Ambulatory Visit

## 2024-05-23 ENCOUNTER — Ambulatory Visit: Admitting: Physician Assistant
